# Patient Record
Sex: Female | Born: 1960
Health system: Southern US, Community
[De-identification: ages and names within clinical notes are randomized; demographics above are authoritative.]

## PROBLEM LIST (undated history)

## (undated) DIAGNOSIS — R609 Edema, unspecified: Secondary | ICD-10-CM

## (undated) DIAGNOSIS — E669 Obesity, unspecified: Secondary | ICD-10-CM

## (undated) DIAGNOSIS — I10 Essential (primary) hypertension: Secondary | ICD-10-CM

## (undated) HISTORY — DX: Edema, unspecified: R60.9

## (undated) HISTORY — PX: TUBAL LIGATION: SHX77

## (undated) HISTORY — PX: CHOLECYSTECTOMY: SHX55

## (undated) HISTORY — DX: Obesity, unspecified: E66.9

---

## 2013-10-01 ENCOUNTER — Encounter (HOSPITAL_BASED_OUTPATIENT_CLINIC_OR_DEPARTMENT_OTHER): Payer: Self-pay | Admitting: Emergency Medicine

## 2013-10-01 ENCOUNTER — Emergency Department (HOSPITAL_BASED_OUTPATIENT_CLINIC_OR_DEPARTMENT_OTHER)
Admission: EM | Admit: 2013-10-01 | Discharge: 2013-10-01 | Disposition: A | Discharge status: Home / Self Care | Payer: BC Managed Care – PPO | Attending: Emergency Medicine | Admitting: Emergency Medicine

## 2013-10-01 DIAGNOSIS — Z0189 Encounter for other specified special examinations: Secondary | ICD-10-CM | POA: Diagnosis not present

## 2013-10-01 DIAGNOSIS — J029 Acute pharyngitis, unspecified: Secondary | ICD-10-CM | POA: Diagnosis present

## 2013-10-01 LAB — RAPID STREP SCREEN (MED CTR MEBANE ONLY): Streptococcus, Group A Screen (Direct): NEGATIVE

## 2013-10-01 NOTE — ED Provider Notes (Signed)
CSN: [id number]     Arrival date & time 10/01/13  1536 History   First MD Initiated Contact with Patient 10/01/13 1603     Chief Complaint  Patient presents with  . Sore Throat     (Consider location/radiation/quality/duration/timing/severity/associated sxs/prior Treatment) HPI Comments: Pt states that they had random strep test at work and hers came back positive. She is not having any symptoms  Patient is a 53 y.o. female presenting with pharyngitis. The history is provided by the patient. No language interpreter was used.  Sore Throat This is a new problem. The current episode started today. The problem occurs constantly. The problem has been unchanged. Pertinent negatives include no fever, rash or sore throat. Nothing aggravates the symptoms. She has tried nothing for the symptoms.    History reviewed. No pertinent past medical history. Past Surgical History  Procedure Laterality Date  . Cholecystectomy     No family history on file. History  Substance Use Topics  . Smoking status: Never Smoker   . Smokeless tobacco: Not on file  . Alcohol Use: No   OB History   Grav Para Term Preterm Abortions TAB SAB Ect Mult Living                 Review of Systems  Constitutional: Negative for fever.  HENT: Negative for sore throat.   Respiratory: Negative.   Cardiovascular: Negative.   Skin: Negative for rash.      Allergies  Review of patient's allergies indicates no known allergies.  Home Medications   Prior to Admission medications   Not on File   BP 177/107  Pulse 68  Temp(Src) 98.7 F (37.1 C) (Oral)  Resp 18  Ht  (1.575 m)  Wt 200 lb (90.719 kg)  BMI 36.57 kg/m2  SpO2 99% Physical Exam  Nursing note and vitals reviewed. Constitutional: She is oriented to person, place, and time. She appears well-developed and well-nourished.  HENT:  Right Ear: External ear normal.  Left Ear: External ear normal.  Mouth/Throat: Oropharynx is clear and moist.   Neck: Neck supple.  Cardiovascular: Normal rate and regular rhythm.   Pulmonary/Chest: Effort normal and breath sounds normal.  Musculoskeletal: Normal range of motion.  Neurological: She is alert and oriented to person, place, and time.    ED Course  Procedures (including critical care time) Labs Review Labs Reviewed  RAPID STREP SCREEN  CULTURE, GROUP A STREP    Imaging Review No results found.   EKG Interpretation None      MDM   Final diagnoses:  Laboratory test    Negative strep and pt not symptomatic don't think pt needs to be treated at this time    Teressa Lower, NP 10/01/13 1640

## 2013-10-01 NOTE — ED Notes (Signed)
She had a random strep test at work yesterday that was positive. Denies sore throat.

## 2013-10-01 NOTE — ED Notes (Signed)
Note given for pt to return to work- pt verbalizes understanding to f/u with PCP this week for blood pressure check

## 2013-10-01 NOTE — ED Provider Notes (Signed)
Medical screening examination/treatment/procedure(s) were performed by non-physician practitioner and as supervising physician I was immediately available for consultation/collaboration.   EKG Interpretation None       Ethelda Chick, MD 10/01/13 [phone]

## 2013-10-01 NOTE — Discharge Instructions (Signed)
Preventive Care for Adults A healthy lifestyle and preventive care can promote health and wellness. Preventive health guidelines for women include the following key practices.  A routine yearly physical is a good way to check with your health care provider about your health and preventive screening. It is a chance to share any concerns and updates on your health and to receive a thorough exam.  Visit your dentist for a routine exam and preventive care every 6 months. Brush your teeth twice a day and floss once a day. Good oral hygiene prevents tooth decay and gum disease.  The frequency of eye exams is based on your age, health, family medical history, use of contact lenses, and other factors. Follow your health care provider's recommendations for frequency of eye exams.  Eat a healthy diet. Foods like vegetables, fruits, whole grains, low-fat dairy products, and lean protein foods contain the nutrients you need without too many calories. Decrease your intake of foods high in solid fats, added sugars, and salt. Eat the right amount of calories for you.Get information about a proper diet from your health care provider, if necessary.  Regular physical exercise is one of the most important things you can do for your health. Most adults should get at least 150 minutes of moderate-intensity exercise (any activity that increases your heart rate and causes you to sweat) each week. In addition, most adults need muscle-strengthening exercises on 2 or more days a week.  Maintain a healthy weight. The body mass index (BMI) is a screening tool to identify possible weight problems. It provides an estimate of body fat based on height and weight. Your health care provider can find your BMI and can help you achieve or maintain a healthy weight.For adults 20 years and older:  A BMI below 18.5 is considered underweight.  A BMI of 18.5 to 24.9 is normal.  A BMI of 25 to 29.9 is considered overweight.  A BMI of  30 and above is considered obese.  Maintain normal blood lipids and cholesterol levels by exercising and minimizing your intake of saturated fat. Eat a balanced diet with plenty of fruit and vegetables. Blood tests for lipids and cholesterol should begin at age 20 and be repeated every 5 years. If your lipid or cholesterol levels are high, you are over 50, or you are at high risk for heart disease, you may need your cholesterol levels checked more frequently.Ongoing high lipid and cholesterol levels should be treated with medicines if diet and exercise are not working.  If you smoke, find out from your health care provider how to quit. If you do not use tobacco, do not start.  Lung cancer screening is recommended for adults aged 55-80 years who are at high risk for developing lung cancer because of a history of smoking. A yearly low-dose CT scan of the lungs is recommended for people who have at least a 30-pack-year history of smoking and are a current smoker or have quit within the past 15 years. A pack year of smoking is smoking an average of 1 pack of cigarettes a day for 1 year (for example: 1 pack a day for 30 years or 2 packs a day for 15 years). Yearly screening should continue until the smoker has stopped smoking for at least 15 years. Yearly screening should be stopped for people who develop a health problem that would prevent them from having lung cancer treatment.  If you are pregnant, do not drink alcohol. If you are breastfeeding,   be very cautious about drinking alcohol. If you are not pregnant and choose to drink alcohol, do not have more than 1 drink per day. One drink is considered to be 12 ounces (355 mL) of beer, 5 ounces (148 mL) of wine, or 1.5 ounces (44 mL) of liquor.  Avoid use of street drugs. Do not share needles with anyone. Ask for help if you need support or instructions about stopping the use of drugs.  High blood pressure causes heart disease and increases the risk of  stroke. Your blood pressure should be checked at least every 1 to 2 years. Ongoing high blood pressure should be treated with medicines if weight loss and exercise do not work.  If you are 55-79 years old, ask your health care provider if you should take aspirin to prevent strokes.  Diabetes screening involves taking a blood sample to check your fasting blood sugar level. This should be done once every 3 years, after age 45, if you are within normal weight and without risk factors for diabetes. Testing should be considered at a younger age or be carried out more frequently if you are overweight and have at least 1 risk factor for diabetes.  Breast cancer screening is essential preventive care for women. You should practice "breast self-awareness." This means understanding the normal appearance and feel of your breasts and may include breast self-examination. Any changes detected, no matter how small, should be reported to a health care provider. Women in their 20s and 30s should have a clinical breast exam (CBE) by a health care provider as part of a regular health exam every 1 to 3 years. After age 40, women should have a CBE every year. Starting at age 40, women should consider having a mammogram (breast X-ray test) every year. Women who have a family history of breast cancer should talk to their health care provider about genetic screening. Women at a high risk of breast cancer should talk to their health care providers about having an MRI and a mammogram every year.  Breast cancer gene (BRCA)-related cancer risk assessment is recommended for women who have family members with BRCA-related cancers. BRCA-related cancers include breast, ovarian, tubal, and peritoneal cancers. Having family members with these cancers may be associated with an increased risk for harmful changes (mutations) in the breast cancer genes BRCA1 and BRCA2. Results of the assessment will determine the need for genetic counseling and  BRCA1 and BRCA2 testing.  Routine pelvic exams to screen for cancer are no longer recommended for nonpregnant women who are considered low risk for cancer of the pelvic organs (ovaries, uterus, and vagina) and who do not have symptoms. Ask your health care provider if a screening pelvic exam is right for you.  If you have had past treatment for cervical cancer or a condition that could lead to cancer, you need Pap tests and screening for cancer for at least 20 years after your treatment. If Pap tests have been discontinued, your risk factors (such as having a new sexual partner) need to be reassessed to determine if screening should be resumed. Some women have medical problems that increase the chance of getting cervical cancer. In these cases, your health care provider may recommend more frequent screening and Pap tests.  The HPV test is an additional test that may be used for cervical cancer screening. The HPV test looks for the virus that can cause the cell changes on the cervix. The cells collected during the Pap test can be   tested for HPV. The HPV test could be used to screen women aged 30 years and older, and should be used in women of any age who have unclear Pap test results. After the age of 30, women should have HPV testing at the same frequency as a Pap test.  Colorectal cancer can be detected and often prevented. Most routine colorectal cancer screening begins at the age of 50 years and continues through age 75 years. However, your health care provider may recommend screening at an earlier age if you have risk factors for colon cancer. On a yearly basis, your health care provider may provide home test kits to check for hidden blood in the stool. Use of a small camera at the end of a tube, to directly examine the colon (sigmoidoscopy or colonoscopy), can detect the earliest forms of colorectal cancer. Talk to your health care provider about this at age 50, when routine screening begins. Direct  exam of the colon should be repeated every 5-10 years through age 75 years, unless early forms of pre-cancerous polyps or small growths are found.  People who are at an increased risk for hepatitis B should be screened for this virus. You are considered at high risk for hepatitis B if:  You were born in a country where hepatitis B occurs often. Talk with your health care provider about which countries are considered high risk.  Your parents were born in a high-risk country and you have not received a shot to protect against hepatitis B (hepatitis B vaccine).  You have HIV or AIDS.  You use needles to inject street drugs.  You live with, or have sex with, someone who has hepatitis B.  You get hemodialysis treatment.  You take certain medicines for conditions like cancer, organ transplantation, and autoimmune conditions.  Hepatitis C blood testing is recommended for all people born from 1945 through 1965 and any individual with known risks for hepatitis C.  Practice safe sex. Use condoms and avoid high-risk sexual practices to reduce the spread of sexually transmitted infections (STIs). STIs include gonorrhea, chlamydia, syphilis, trichomonas, herpes, HPV, and human immunodeficiency virus (HIV). Herpes, HIV, and HPV are viral illnesses that have no cure. They can result in disability, cancer, and death.  You should be screened for sexually transmitted illnesses (STIs) including gonorrhea and chlamydia if:  You are sexually active and are younger than 24 years.  You are older than 24 years and your health care provider tells you that you are at risk for this type of infection.  Your sexual activity has changed since you were last screened and you are at an increased risk for chlamydia or gonorrhea. Ask your health care provider if you are at risk.  If you are at risk of being infected with HIV, it is recommended that you take a prescription medicine daily to prevent HIV infection. This is  called preexposure prophylaxis (PrEP). You are considered at risk if:  You are a heterosexual woman, are sexually active, and are at increased risk for HIV infection.  You take drugs by injection.  You are sexually active with a partner who has HIV.  Talk with your health care provider about whether you are at high risk of being infected with HIV. If you choose to begin PrEP, you should first be tested for HIV. You should then be tested every 3 months for as long as you are taking PrEP.  Osteoporosis is a disease in which the bones lose minerals and strength   with aging. This can result in serious bone fractures or breaks. The risk of osteoporosis can be identified using a bone density scan. Women ages 65 years and over and women at risk for fractures or osteoporosis should discuss screening with their health care providers. Ask your health care provider whether you should take a calcium supplement or vitamin D to reduce the rate of osteoporosis.  Menopause can be associated with physical symptoms and risks. Hormone replacement therapy is available to decrease symptoms and risks. You should talk to your health care provider about whether hormone replacement therapy is right for you.  Use sunscreen. Apply sunscreen liberally and repeatedly throughout the day. You should seek shade when your shadow is shorter than you. Protect yourself by wearing long sleeves, pants, a wide-brimmed hat, and sunglasses year round, whenever you are outdoors.  Once a month, do a whole body skin exam, using a mirror to look at the skin on your back. Tell your health care provider of new moles, moles that have irregular borders, moles that are larger than a pencil eraser, or moles that have changed in shape or color.  Stay current with required vaccines (immunizations).  Influenza vaccine. All adults should be immunized every year.  Tetanus, diphtheria, and acellular pertussis (Td, Tdap) vaccine. Pregnant women should  receive 1 dose of Tdap vaccine during each pregnancy. The dose should be obtained regardless of the length of time since the last dose. Immunization is preferred during the 27th-36th week of gestation. An adult who has not previously received Tdap or who does not know her vaccine status should receive 1 dose of Tdap. This initial dose should be followed by tetanus and diphtheria toxoids (Td) booster doses every 10 years. Adults with an unknown or incomplete history of completing a 3-dose immunization series with Td-containing vaccines should begin or complete a primary immunization series including a Tdap dose. Adults should receive a Td booster every 10 years.  Varicella vaccine. An adult without evidence of immunity to varicella should receive 2 doses or a second dose if she has previously received 1 dose. Pregnant females who do not have evidence of immunity should receive the first dose after pregnancy. This first dose should be obtained before leaving the health care facility. The second dose should be obtained 4-8 weeks after the first dose.  Human papillomavirus (HPV) vaccine. Females aged 13-26 years who have not received the vaccine previously should obtain the 3-dose series. The vaccine is not recommended for use in pregnant females. However, pregnancy testing is not needed before receiving a dose. If a female is found to be pregnant after receiving a dose, no treatment is needed. In that case, the remaining doses should be delayed until after the pregnancy. Immunization is recommended for any person with an immunocompromised condition through the age of 26 years if she did not get any or all doses earlier. During the 3-dose series, the second dose should be obtained 4-8 weeks after the first dose. The third dose should be obtained 24 weeks after the first dose and 16 weeks after the second dose.  Zoster vaccine. One dose is recommended for adults aged 60 years or older unless certain conditions are  present.  Measles, mumps, and rubella (MMR) vaccine. Adults born before 1957 generally are considered immune to measles and mumps. Adults born in 1957 or later should have 1 or more doses of MMR vaccine unless there is a contraindication to the vaccine or there is laboratory evidence of immunity to   each of the three diseases. A routine second dose of MMR vaccine should be obtained at least 28 days after the first dose for students attending postsecondary schools, health care workers, or international travelers. People who received inactivated measles vaccine or an unknown type of measles vaccine during 1963-1967 should receive 2 doses of MMR vaccine. People who received inactivated mumps vaccine or an unknown type of mumps vaccine before 1979 and are at high risk for mumps infection should consider immunization with 2 doses of MMR vaccine. For females of childbearing age, rubella immunity should be determined. If there is no evidence of immunity, females who are not pregnant should be vaccinated. If there is no evidence of immunity, females who are pregnant should delay immunization until after pregnancy. Unvaccinated health care workers born before 1957 who lack laboratory evidence of measles, mumps, or rubella immunity or laboratory confirmation of disease should consider measles and mumps immunization with 2 doses of MMR vaccine or rubella immunization with 1 dose of MMR vaccine.  Pneumococcal 13-valent conjugate (PCV13) vaccine. When indicated, a person who is uncertain of her immunization history and has no record of immunization should receive the PCV13 vaccine. An adult aged 19 years or older who has certain medical conditions and has not been previously immunized should receive 1 dose of PCV13 vaccine. This PCV13 should be followed with a dose of pneumococcal polysaccharide (PPSV23) vaccine. The PPSV23 vaccine dose should be obtained at least 8 weeks after the dose of PCV13 vaccine. An adult aged 19  years or older who has certain medical conditions and previously received 1 or more doses of PPSV23 vaccine should receive 1 dose of PCV13. The PCV13 vaccine dose should be obtained 1 or more years after the last PPSV23 vaccine dose.  Pneumococcal polysaccharide (PPSV23) vaccine. When PCV13 is also indicated, PCV13 should be obtained first. All adults aged 65 years and older should be immunized. An adult younger than age 65 years who has certain medical conditions should be immunized. Any person who resides in a nursing home or long-term care facility should be immunized. An adult smoker should be immunized. People with an immunocompromised condition and certain other conditions should receive both PCV13 and PPSV23 vaccines. People with human immunodeficiency virus (HIV) infection should be immunized as soon as possible after diagnosis. Immunization during chemotherapy or radiation therapy should be avoided. Routine use of PPSV23 vaccine is not recommended for American Indians, [place] Natives, or people younger than 65 years unless there are medical conditions that require PPSV23 vaccine. When indicated, people who have unknown immunization and have no record of immunization should receive PPSV23 vaccine. One-time revaccination 5 years after the first dose of PPSV23 is recommended for people aged 19-64 years who have chronic kidney failure, nephrotic syndrome, asplenia, or immunocompromised conditions. People who received 1-2 doses of PPSV23 before age 65 years should receive another dose of PPSV23 vaccine at age 65 years or later if at least 5 years have passed since the previous dose. Doses of PPSV23 are not needed for people immunized with PPSV23 at or after age 65 years.  Meningococcal vaccine. Adults with asplenia or persistent complement component deficiencies should receive 2 doses of quadrivalent meningococcal conjugate (MenACWY-D) vaccine. The doses should be obtained at least 2 months apart.  Microbiologists working with certain meningococcal bacteria, military recruits, people at risk during an outbreak, and people who travel to or live in countries with a high rate of meningitis should be immunized. A first-year college student up through age   21 years who is living in a residence hall should receive a dose if she did not receive a dose on or after her 16th birthday. Adults who have certain high-risk conditions should receive one or more doses of vaccine.  Hepatitis A vaccine. Adults who wish to be protected from this disease, have certain high-risk conditions, work with hepatitis A-infected animals, work in hepatitis A research labs, or travel to or work in countries with a high rate of hepatitis A should be immunized. Adults who were previously unvaccinated and who anticipate close contact with an international adoptee during the first 60 days after arrival in the [place] from a country with a high rate of hepatitis A should be immunized.  Hepatitis B vaccine. Adults who wish to be protected from this disease, have certain high-risk conditions, may be exposed to blood or other infectious body fluids, are household contacts or sex partners of hepatitis B positive people, are clients or workers in certain care facilities, or travel to or work in countries with a high rate of hepatitis B should be immunized.  Haemophilus influenzae type b (Hib) vaccine. A previously unvaccinated person with asplenia or sickle cell disease or having a scheduled splenectomy should receive 1 dose of Hib vaccine. Regardless of previous immunization, a recipient of a hematopoietic stem cell transplant should receive a 3-dose series 6-12 months after her successful transplant. Hib vaccine is not recommended for adults with HIV infection. Preventive Services / Frequency Ages 19 to 39 years  Blood pressure check.** / Every 1 to 2 years.  Lipid and cholesterol check.** / Every 5 years beginning at age  20.  Clinical breast exam.** / Every 3 years for women in their 20s and 30s.  BRCA-related cancer risk assessment.** / For women who have family members with a BRCA-related cancer (breast, ovarian, tubal, or peritoneal cancers).  Pap test.** / Every 2 years from ages 21 through 29. Every 3 years starting at age 30 through age 65 or 70 with a history of 3 consecutive normal Pap tests.  HPV screening.** / Every 3 years from ages 30 through ages 65 to 70 with a history of 3 consecutive normal Pap tests.  Hepatitis C blood test.** / For any individual with known risks for hepatitis C.  Skin self-exam. / Monthly.  Influenza vaccine. / Every year.  Tetanus, diphtheria, and acellular pertussis (Tdap, Td) vaccine.** / Consult your health care provider. Pregnant women should receive 1 dose of Tdap vaccine during each pregnancy. 1 dose of Td every 10 years.  Varicella vaccine.** / Consult your health care provider. Pregnant females who do not have evidence of immunity should receive the first dose after pregnancy.  HPV vaccine. / 3 doses over 6 months, if 26 and younger. The vaccine is not recommended for use in pregnant females. However, pregnancy testing is not needed before receiving a dose.  Measles, mumps, rubella (MMR) vaccine.** / You need at least 1 dose of MMR if you were born in 1957 or later. You may also need a 2nd dose. For females of childbearing age, rubella immunity should be determined. If there is no evidence of immunity, females who are not pregnant should be vaccinated. If there is no evidence of immunity, females who are pregnant should delay immunization until after pregnancy.  Pneumococcal 13-valent conjugate (PCV13) vaccine.** / Consult your health care provider.  Pneumococcal polysaccharide (PPSV23) vaccine.** / 1 to 2 doses if you smoke cigarettes or if you have certain conditions.  Meningococcal vaccine.** /   1 dose if you are age 19 to 21 years and a first-year college  student living in a residence hall, or have one of several medical conditions, you need to get vaccinated against meningococcal disease. You may also need additional booster doses.  Hepatitis A vaccine.** / Consult your health care provider.  Hepatitis B vaccine.** / Consult your health care provider.  Haemophilus influenzae type b (Hib) vaccine.** / Consult your health care provider. Ages 40 to 64 years  Blood pressure check.** / Every 1 to 2 years.  Lipid and cholesterol check.** / Every 5 years beginning at age 20 years.  Lung cancer screening. / Every year if you are aged 55-80 years and have a 30-pack-year history of smoking and currently smoke or have quit within the past 15 years. Yearly screening is stopped once you have quit smoking for at least 15 years or develop a health problem that would prevent you from having lung cancer treatment.  Clinical breast exam.** / Every year after age 40 years.  BRCA-related cancer risk assessment.** / For women who have family members with a BRCA-related cancer (breast, ovarian, tubal, or peritoneal cancers).  Mammogram.** / Every year beginning at age 40 years and continuing for as long as you are in good health. Consult with your health care provider.  Pap test.** / Every 3 years starting at age 30 years through age 65 or 70 years with a history of 3 consecutive normal Pap tests.  HPV screening.** / Every 3 years from ages 30 years through ages 65 to 70 years with a history of 3 consecutive normal Pap tests.  Fecal occult blood test (FOBT) of stool. / Every year beginning at age 50 years and continuing until age 75 years. You may not need to do this test if you get a colonoscopy every 10 years.  Flexible sigmoidoscopy or colonoscopy.** / Every 5 years for a flexible sigmoidoscopy or every 10 years for a colonoscopy beginning at age 50 years and continuing until age 75 years.  Hepatitis C blood test.** / For all people born from 1945 through  1965 and any individual with known risks for hepatitis C.  Skin self-exam. / Monthly.  Influenza vaccine. / Every year.  Tetanus, diphtheria, and acellular pertussis (Tdap/Td) vaccine.** / Consult your health care provider. Pregnant women should receive 1 dose of Tdap vaccine during each pregnancy. 1 dose of Td every 10 years.  Varicella vaccine.** / Consult your health care provider. Pregnant females who do not have evidence of immunity should receive the first dose after pregnancy.  Zoster vaccine.** / 1 dose for adults aged 60 years or older.  Measles, mumps, rubella (MMR) vaccine.** / You need at least 1 dose of MMR if you were born in 1957 or later. You may also need a 2nd dose. For females of childbearing age, rubella immunity should be determined. If there is no evidence of immunity, females who are not pregnant should be vaccinated. If there is no evidence of immunity, females who are pregnant should delay immunization until after pregnancy.  Pneumococcal 13-valent conjugate (PCV13) vaccine.** / Consult your health care provider.  Pneumococcal polysaccharide (PPSV23) vaccine.** / 1 to 2 doses if you smoke cigarettes or if you have certain conditions.  Meningococcal vaccine.** / Consult your health care provider.  Hepatitis A vaccine.** / Consult your health care provider.  Hepatitis B vaccine.** / Consult your health care provider.  Haemophilus influenzae type b (Hib) vaccine.** / Consult your health care provider. Ages 65   years and over  Blood pressure check.** / Every 1 to 2 years.  Lipid and cholesterol check.** / Every 5 years beginning at age 20 years.  Lung cancer screening. / Every year if you are aged 55-80 years and have a 30-pack-year history of smoking and currently smoke or have quit within the past 15 years. Yearly screening is stopped once you have quit smoking for at least 15 years or develop a health problem that would prevent you from having lung cancer  treatment.  Clinical breast exam.** / Every year after age 40 years.  BRCA-related cancer risk assessment.** / For women who have family members with a BRCA-related cancer (breast, ovarian, tubal, or peritoneal cancers).  Mammogram.** / Every year beginning at age 40 years and continuing for as long as you are in good health. Consult with your health care provider.  Pap test.** / Every 3 years starting at age 30 years through age 65 or 70 years with 3 consecutive normal Pap tests. Testing can be stopped between 65 and 70 years with 3 consecutive normal Pap tests and no abnormal Pap or HPV tests in the past 10 years.  HPV screening.** / Every 3 years from ages 30 years through ages 65 or 70 years with a history of 3 consecutive normal Pap tests. Testing can be stopped between 65 and 70 years with 3 consecutive normal Pap tests and no abnormal Pap or HPV tests in the past 10 years.  Fecal occult blood test (FOBT) of stool. / Every year beginning at age 50 years and continuing until age 75 years. You may not need to do this test if you get a colonoscopy every 10 years.  Flexible sigmoidoscopy or colonoscopy.** / Every 5 years for a flexible sigmoidoscopy or every 10 years for a colonoscopy beginning at age 50 years and continuing until age 75 years.  Hepatitis C blood test.** / For all people born from 1945 through 1965 and any individual with known risks for hepatitis C.  Osteoporosis screening.** / A one-time screening for women ages 65 years and over and women at risk for fractures or osteoporosis.  Skin self-exam. / Monthly.  Influenza vaccine. / Every year.  Tetanus, diphtheria, and acellular pertussis (Tdap/Td) vaccine.** / 1 dose of Td every 10 years.  Varicella vaccine.** / Consult your health care provider.  Zoster vaccine.** / 1 dose for adults aged 60 years or older.  Pneumococcal 13-valent conjugate (PCV13) vaccine.** / Consult your health care provider.  Pneumococcal  polysaccharide (PPSV23) vaccine.** / 1 dose for all adults aged 65 years and older.  Meningococcal vaccine.** / Consult your health care provider.  Hepatitis A vaccine.** / Consult your health care provider.  Hepatitis B vaccine.** / Consult your health care provider.  Haemophilus influenzae type b (Hib) vaccine.** / Consult your health care provider. ** Family history and personal history of risk and conditions may change your health care provider's recommendations. Document Released: 03/01/2001 Document Revised: 05/20/2013 Document Reviewed: 05/31/2010 [employer] Patient Information 2015 [employer]. This information is not intended to replace advice given to you by your health care provider. Make sure you discuss any questions you have with your health care provider.  

## 2013-10-03 LAB — CULTURE, GROUP A STREP

## 2013-12-11 ENCOUNTER — Other Ambulatory Visit: Payer: Self-pay | Admitting: Ophthalmology

## 2013-12-11 DIAGNOSIS — H4710 Unspecified papilledema: Secondary | ICD-10-CM

## 2013-12-11 DIAGNOSIS — G060 Intracranial abscess and granuloma: Secondary | ICD-10-CM

## 2013-12-22 ENCOUNTER — Ambulatory Visit
Admission: RE | Admit: 2013-12-22 | Discharge: 2013-12-22 | Disposition: A | Discharge status: Home / Self Care | Payer: BC Managed Care – PPO | Source: Ambulatory Visit | Attending: Ophthalmology | Admitting: Ophthalmology

## 2013-12-22 DIAGNOSIS — H4710 Unspecified papilledema: Secondary | ICD-10-CM

## 2013-12-22 DIAGNOSIS — G060 Intracranial abscess and granuloma: Secondary | ICD-10-CM

## 2013-12-22 MED ORDER — GADOBENATE DIMEGLUMINE 529 MG/ML IV SOLN
20.0000 mL | Freq: Once | INTRAVENOUS | Status: AC | PRN
  Administered 2013-12-22: 20 mL via INTRAVENOUS

## 2014-02-13 ENCOUNTER — Encounter: Payer: Self-pay | Admitting: Neurology

## 2014-02-13 ENCOUNTER — Ambulatory Visit (INDEPENDENT_AMBULATORY_CARE_PROVIDER_SITE_OTHER): Payer: BLUE CROSS/BLUE SHIELD | Admitting: Neurology

## 2014-02-13 VITALS — BP 152/94 | HR 86 | Resp 16 | Ht 62.75 in | Wt 216.0 lb

## 2014-02-13 DIAGNOSIS — G629 Polyneuropathy, unspecified: Secondary | ICD-10-CM

## 2014-02-13 DIAGNOSIS — H4710 Unspecified papilledema: Secondary | ICD-10-CM

## 2014-02-13 DIAGNOSIS — G5603 Carpal tunnel syndrome, bilateral upper limbs: Secondary | ICD-10-CM

## 2014-02-13 DIAGNOSIS — G5602 Carpal tunnel syndrome, left upper limb: Secondary | ICD-10-CM

## 2014-02-13 DIAGNOSIS — G5601 Carpal tunnel syndrome, right upper limb: Secondary | ICD-10-CM

## 2014-02-13 LAB — VITAMIN B12: VITAMIN B 12: 392 pg/mL (ref 211–911)

## 2014-02-13 LAB — CBC
HEMATOCRIT: 31.9 % — AB (ref 36.0–46.0)
Hemoglobin: 9.6 g/dL — ABNORMAL LOW (ref 12.0–15.0)
MCH: 19.5 pg — ABNORMAL LOW (ref 26.0–34.0)
MCHC: 30.1 g/dL (ref 30.0–36.0)
MCV: 64.7 fL — ABNORMAL LOW (ref 78.0–100.0)
MPV: 8.6 fL (ref 8.6–12.4)
PLATELETS: 474 10*3/uL — AB (ref 150–400)
RBC: 4.93 MIL/uL (ref 3.87–5.11)
RDW: 20.8 % — AB (ref 11.5–15.5)
WBC: 6.5 10*3/uL (ref 4.0–10.5)

## 2014-02-13 LAB — COMPREHENSIVE METABOLIC PANEL
ALK PHOS: 80 U/L (ref 39–117)
ALT: 15 U/L (ref 0–35)
AST: 15 U/L (ref 0–37)
Albumin: 3.7 g/dL (ref 3.5–5.2)
BUN: 8 mg/dL (ref 6–23)
CALCIUM: 8.9 mg/dL (ref 8.4–10.5)
CHLORIDE: 105 meq/L (ref 96–112)
CO2: 25 mEq/L (ref 19–32)
Creat: 0.62 mg/dL (ref 0.50–1.10)
Glucose, Bld: 80 mg/dL (ref 70–99)
Potassium: 3.9 mEq/L (ref 3.5–5.3)
Sodium: 139 mEq/L (ref 135–145)
TOTAL PROTEIN: 6.9 g/dL (ref 6.0–8.3)
Total Bilirubin: 0.3 mg/dL (ref 0.2–1.2)

## 2014-02-13 LAB — PROTIME-INR
INR: 1 (ref <1.50)
Prothrombin Time: 13.2 seconds (ref 11.6–15.2)

## 2014-02-13 LAB — TSH: TSH: 1.239 u[IU]/mL (ref 0.350–4.500)

## 2014-02-13 NOTE — Patient Instructions (Addendum)
1. Schedule lumbar puncture to measure opening pressure 2. Bloodwork for CBC, CMP, PT/INR, TSH, B12 3. Minimize Tylenol intake to 2-3 a week to avoid rebound headaches 4. Start wearing a wrist splint on both wrists to prevent worsening of carpal tunnel symptoms 5. Follow-up after lumbar puncture 6. Refer to establish care with PCP

## 2014-02-13 NOTE — Progress Notes (Signed)
NEUROLOGY CONSULTATION NOTE  Judith Barr MRN: [id number] DOB: 1960-01-18  Referring provider: Dr. Merrily Pewony Choi Primary care provider: none listed  Reason for consult:  Optic disc papilledema, blurred vision, headache  Dear Dr Alvino Chapelhoi:  Thank you for your kind referral of Judith Barr for consultation of the above symptoms. Although her history is well known to you, please allow me to reiterate it for the purpose of our medical record. Records and images were personally reviewed where available.  HISTORY OF PRESENT ILLNESS: This is a pleasant 54 year old right-handed woman with no significant past medical history except for headaches since her late 5130s. She reports that over the past couple of months, her headaches have changes, now constant, waxing and waning from a 7/10 to 10/10 intensity. When more intense, she would feel off balance and catch herself, she feels like the ground drops under her briefly 3-4 times a week. Headaches start in the left occipital region then radiate diffusely, with throbbing pain that make her ears hurt. She has some pulsatile tinnitus that comes and goes. She has been taking Tylenol BID for the past couple of weeks. Recently, she has been nauseated and irritated by lights and sounds. She feels her vision is blurred and she cannot see much out of the left eye. She has a cataract but tells me that cataract surgery could not be done until papilledema is addressed. Records from her ophthalmologist were reviewed, on initial visit in October 2015, she had reported decreased left visual acutiy for 2 months. She was noted to have subtle blurred margins OU, hard to see OS optic nerve. MRI brain and orbits with and without contrast done 12/2013 was reported as normal. MRV was negative for venous thrombosis. Repeat vision test in December 2015 showed bilateral mild disc edema.   She denies any dizziness, diplopia, dysarthria, dysphagia, back pain, bowel/bladder dysfunction. 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 She denies any family history of headaches. She has gained a lot of weight since turning 51.   PAST MEDICAL HISTORY: No past medical history on file.  PAST SURGICAL HISTORY: Past Surgical History  Procedure Laterality Date  . Cholecystectomy    . Tubal ligation      MEDICATIONS: No current outpatient prescriptions on file prior to visit.   No current facility-administered medications on file prior to visit.    ALLERGIES: No Known Allergies  FAMILY HISTORY: Family History  Problem Relation Age of Onset  . Diabetes Mother   . Diabetes Sister   . Diabetes Maternal Grandmother     SOCIAL HISTORY: History   Social History  . Marital Status: Married    Spouse Name: N/A    Number of Children: N/A  . Years of Education: N/A   Occupational History  . Not on file.   Social History Main Topics  . Smoking status: Never Smoker   . Smokeless tobacco: Not on file  . Alcohol Use: No  . Drug Use: No  . Sexual Activity: Not on file   Other Topics Concern  . Not on file   Social History Narrative    REVIEW OF SYSTEMS: Constitutional: No fevers, chills, or sweats, no generalized fatigue, change in appetite Eyes: No visual changes, double vision, eye pain Ear, nose and throat: No hearing loss, ear pain, nasal congestion, sore throat Cardiovascular: No chest pain, palpitations Respiratory:  No shortness of breath at rest or with exertion, wheezes GastrointestinaI: No  nausea, vomiting, diarrhea, abdominal pain, fecal incontinence Genitourinary:  No dysuria, urinary retention or frequency Musculoskeletal:  +neck pain, no back pain Integumentary: No rash, pruritus, skin lesions Neurological: as above Psychiatric: No depression, insomnia, anxiety Endocrine: No palpitations, fatigue, diaphoresis, mood swings, change in appetite, change in weight, increased thirst Hematologic/Lymphatic:  No anemia,  purpura, petechiae. Allergic/Immunologic: no itchy/runny eyes, nasal congestion, recent allergic reactions, rashes  PHYSICAL EXAM: Filed Vitals:   02/13/14 0843  BP: 152/94  Pulse: 86  Resp: 16   General: No acute distress Head:  Normocephalic/atraumatic Eyes: Fundoscopic exam shows blurred disc bilaterally, venous pulsations noted.  Neck: supple, no paraspinal tenderness, full range of motion Back: No paraspinal tenderness Heart: regular rate and rhythm Lungs: Clear to auscultation bilaterally. Vascular: No carotid bruits. Skin/Extremities: No rash, no edema Neurological Exam: Mental status: alert and oriented to person, place, and time, no dysarthria or aphasia, Fund of knowledge is appropriate.  Recent and remote memory are intact.  Attention and concentration are normal.    Able to name objects and repeat phrases. Cranial nerves: CN I: not tested CN II: pupils equal, round and reactive to light, visual fields intact, fundi as above CN III, IV, VI:  full range of motion, no nystagmus, no ptosis CN V: facial sensation intact CN VII: upper and lower face symmetric CN VIII: hearing intact to finger rub CN IX, X: gag intact, uvula midline CN XI: sternocleidomastoid and trapezius muscles intact CN XII: tongue midline Bulk & Tone: normal, no fasciculations. Motor: 5/5 throughout with no pronator drift. Sensation: decreased cold and pin on the left LE, decreased pin on right hand, decreased vibration to both ankles. Romberg test positive sway Deep Tendon Reflexes: +2 throughout except for absent ankle jerks, no ankle clonus Plantar responses: downgoing bilaterally Cerebellar: no incoordination on finger to nose, heel to shin. No dysdiadochokinesia Gait: narrow-based and steady, able to tandem walk adequately. Tremor: none +Phalen's sign with tingling in both hands; negative Tinel sign at wrist and elbow  IMPRESSION: This is a pleasant 54 year old right-handed woman with a  history of headaches that have increased in frequency over the past few months, pulsatile tinnitus, blurred vision, with bilateral optic disc edema concerning for idiopathic intracranial hypertension. MRI/MRV brain unremarkable. She will be scheduled for a lumbar puncture to measure opening pressure. We will plan to start either Diamox or Topamax after the LP. Check safety labs CBC, CMP, PT/INR. 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 She will start wearing wrist splints. We discussed rebound headaches and minimizing Tylenol intake to 2-3 a week. We also discussed weight loss and the benefits of this in patients with IIH. She will follow-up after the LP.  Thank you for allowing me to participate in the care of this patient. Please do not hesitate to call for any questions or concerns.   Patrcia Dolly, M.D.  CC: Dr. Alvino Chapel

## 2014-02-17 ENCOUNTER — Telehealth: Payer: Self-pay | Admitting: Neurology

## 2014-02-17 NOTE — Telephone Encounter (Signed)
Pt states that someone from our office called her and she was unsure who called but thought it was a Engineer, civil (consulting)nurse. Please call [phone] or [phone]

## 2014-02-18 ENCOUNTER — Encounter: Payer: Self-pay | Admitting: Neurology

## 2014-02-20 ENCOUNTER — Other Ambulatory Visit: Payer: Self-pay | Admitting: Neurology

## 2014-02-20 ENCOUNTER — Ambulatory Visit
Admission: RE | Admit: 2014-02-20 | Discharge: 2014-02-20 | Disposition: A | Discharge status: Home / Self Care | Payer: BLUE CROSS/BLUE SHIELD | Source: Ambulatory Visit | Attending: Neurology | Admitting: Neurology

## 2014-02-20 VITALS — BP 138/85 | HR 52

## 2014-02-20 DIAGNOSIS — H4710 Unspecified papilledema: Secondary | ICD-10-CM

## 2014-02-20 DIAGNOSIS — R836 Abnormal cytological findings in cerebrospinal fluid: Secondary | ICD-10-CM

## 2014-02-20 LAB — CSF CELL COUNT WITH DIFFERENTIAL
RBC COUNT CSF: 0 uL
Tube #: 3
WBC CSF: 0 uL (ref 0–5)

## 2014-02-20 LAB — PROTEIN, CSF: Total Protein, CSF: 19 mg/dL (ref 15–45)

## 2014-02-20 LAB — GLUCOSE, CSF: GLUCOSE CSF: 60 mg/dL (ref 43–76)

## 2014-02-20 NOTE — Discharge Instructions (Signed)

## 2014-02-20 NOTE — Progress Notes (Signed)
Discharge instructions explained to patient.  jkl

## 2014-02-21 ENCOUNTER — Telehealth: Payer: Self-pay | Admitting: Neurology

## 2014-02-21 NOTE — Telephone Encounter (Signed)
Left VM for patient to call back to discuss LP opening pressure 31, plan to start Diamox

## 2014-02-21 NOTE — Telephone Encounter (Signed)
Tried to reach Judith Barr third time today. Tiffany, can you pls f/u with her on Monday. Pls let her know the spinal tap had shown elevated pressure, I would like her to start Diamox 250mg  twice a day to reduce pressure. Pls let her know main side effects of medication in some people are tingling in fingers and toes, increased urination. Do not stop unless very bothersome. Pls send Rx. Thanks

## 2014-02-21 NOTE — Telephone Encounter (Signed)
Tried calling again, left VM for patient to call back

## 2014-02-23 LAB — CSF CULTURE W GRAM STAIN
Gram Stain: NONE SEEN
Organism ID, Bacteria: NO GROWTH

## 2014-02-23 LAB — CSF CULTURE: GRAM STAIN: NONE SEEN

## 2014-02-24 MED ORDER — ACETAZOLAMIDE 250 MG PO TABS: 250.0000 mg | ORAL_TABLET | Freq: Two times a day (BID) | ORAL | Status: DC

## 2014-02-24 NOTE — Addendum Note (Signed)
Addended by: Franciso BendMCNEIL, Josemiguel Gries M on: 02/24/2014 09:51 AM   Modules accepted: Orders

## 2014-02-24 NOTE — Telephone Encounter (Signed)
Patient did return my call. Explained to her the result of LP & the need for the new Rx. Did advise patient about side effects. Rx was sent to patient's pharmacy.

## 2014-02-24 NOTE — Telephone Encounter (Signed)
Left msg at patients home to return my call. 

## 2014-03-14 ENCOUNTER — Encounter: Payer: Self-pay | Admitting: Neurology

## 2014-03-14 ENCOUNTER — Ambulatory Visit (INDEPENDENT_AMBULATORY_CARE_PROVIDER_SITE_OTHER): Payer: BLUE CROSS/BLUE SHIELD | Admitting: Neurology

## 2014-03-14 VITALS — BP 110/82 | HR 68 | Ht 62.0 in | Wt 221.0 lb

## 2014-03-14 DIAGNOSIS — G932 Benign intracranial hypertension: Secondary | ICD-10-CM

## 2014-03-14 MED ORDER — ACETAZOLAMIDE 250 MG PO TABS: ORAL_TABLET | ORAL | Status: DC

## 2014-03-14 NOTE — Progress Notes (Deleted)
NEUROLOGY FOLLOW UP OFFICE NOTE  Judith Barr [id number]  HISTORY OF PRESENT ILLNESS: I had the pleasure of seeing *** in follow-up in the neurology clinic on ***.  The patient was last seen on *** and is accompanied by *** today.  Records and images were personally reviewed where available.  ***. Vision worsening, more blurry; left eye No vision loss, starting to itch and burn Legs feel sore; occl tingling;still feeling the drop but less HAs 5/10; feels ear infxn on right hurts Stopped tylenol  This is a pleasant 54 year old right-handed woman with no significant past medical history except for headaches since her late 27s. She reports that over the past couple of months, her headaches have changes, now constant, waxing and waning from a 7/10 to 10/10 intensity. When more intense, she would feel off balance and catch herself, she feels like the ground drops under her briefly 3-4 times a week. Headaches start in the left occipital region then radiate diffusely, with throbbing pain that make her ears hurt. She has some pulsatile tinnitus that comes and goes. She has been taking Tylenol BID for the past couple of weeks. Recently, she has been nauseated and irritated by lights and sounds. She feels her vision is blurred and she cannot see much out of the left eye. She has a cataract but tells me that cataract surgery could not be done until papilledema is addressed. Records from her ophthalmologist were reviewed, on initial visit in October 2015, she had reported decreased left visual acutiy for 2 months. She was noted to have subtle blurred margins OU, hard to see OS optic nerve. MRI brain and orbits with and without contrast done 12/2013 was reported as normal. MRV was negative for venous thrombosis. Repeat vision test in December 2015 showed bilateral mild disc edema.   She denies any dizziness, diplopia, dysarthria, dysphagia, back pain, bowel/bladder dysfunction. 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 She denies any family history of headaches. She has gained a lot of weight since turning 51.  PAST MEDICAL HISTORY: No past medical history on file.  MEDICATIONS: Current Outpatient Prescriptions on File Prior to Visit  Medication Sig Dispense Refill  . acetaZOLAMIDE (DIAMOX) 250 MG tablet Take 1 tablet (250 mg total) by mouth 2 (two) times daily. 60 tablet 3   No current facility-administered medications on file prior to visit.    ALLERGIES: No Known Allergies  FAMILY HISTORY: Family History  Problem Relation Age of Onset  . Diabetes Mother   . Diabetes Sister   . Diabetes Maternal Grandmother     SOCIAL HISTORY: History   Social History  . Marital Status: Married    Spouse Name: N/A  . Number of Children: N/A  . Years of Education: N/A   Occupational History  . Not on file.   Social History Main Topics  . Smoking status: Never Smoker   . Smokeless tobacco: Not on file  . Alcohol Use: No  . Drug Use: No  . Sexual Activity: Not on file   Other Topics Concern  . Not on file   Social History Narrative    REVIEW OF SYSTEMS: Constitutional: No fevers, chills, or sweats, no generalized fatigue, change in appetite Eyes: No visual changes, double vision, eye pain Ear, nose and throat: No hearing loss, ear pain, nasal congestion, sore throat Cardiovascular: No chest pain, palpitations Respiratory:  No shortness of breath at rest or with exertion,  wheezes GastrointestinaI: No nausea, vomiting, diarrhea, abdominal pain, fecal incontinence Genitourinary:  No dysuria, urinary retention or frequency Musculoskeletal:  No neck pain, back pain Integumentary: No rash, pruritus, skin lesions Neurological: as above Psychiatric: No depression, insomnia, anxiety Endocrine: No palpitations, fatigue, diaphoresis, mood swings, change in appetite, change in weight, increased thirst Hematologic/Lymphatic:  No anemia, purpura,  petechiae. Allergic/Immunologic: no itchy/runny eyes, nasal congestion, recent allergic reactions, rashes  PHYSICAL EXAM: Filed Vitals:   03/14/14 1240  BP: 110/82  Pulse: 68   General: No acute distress Head:  Normocephalic/atraumatic Neck: supple, no paraspinal tenderness, full range of motion Heart:  Regular rate and rhythm Lungs:  Clear to auscultation bilaterally Back: No paraspinal tenderness Skin/Extremities: No rash, no edema Neurological Exam: alert and oriented to person, place, and time. No aphasia or dysarthria. Fund of knowledge is appropriate.  Recent and remote memory are intact.  Attention and concentration are normal.    Able to name objects and repeat phrases. Cranial nerves: Pupils equal, round, reactive to light.  Fundoscopic exam unremarkable, no papilledema. Extraocular movements intact with no nystagmus. Visual fields full. Facial sensation intact. No facial asymmetry. Tongue, uvula, palate midline.  Motor: Bulk and tone normal, muscle strength 5/5 throughout with no pronator drift.  Sensation to light touch, temperature and vibration intact.  No extinction to double simultaneous stimulation.  Deep tendon reflexes 2+ throughout, toes downgoing.  Finger to nose testing intact.  Gait narrow-based and steady, able to tandem walk adequately.  Romberg negative.  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 MRI/MRV brain unremarkable. She will be scheduled for a lumbar puncture to measure opening pressure. We will plan to start either Diamox or Topamax after the LP. Check safety labs CBC, CMP, PT/INR. 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 She will start wearing wrist splints. We discussed  rebound headaches and minimizing Tylenol intake to 2-3 a week. We also discussed weight loss and the benefits of this in patients with IIH. She will follow-up after the LP.   Medications: Diagnostic testing: Labs: Imaging: Consults: Return to clinic in *** months.  Thank you for allowing me to participate in *** care.  Please do not hesitate to call for any questions or concerns.  The duration of this appointment visit was *** minutes of face-to-face time with the patient.  Greater than 50% of this time was spent in counseling, explanation of diagnosis, planning of further management, and coordination of care.   Patrcia DollyKaren Aquino, M.D.   CC: ***

## 2014-03-14 NOTE — Patient Instructions (Addendum)
1. Increase Diamox 250mg : Take 1 tab in AM, 2 tabs in PM for 1 week, then increase to 2 tabs twice a day 2. Call your eye doctor for earlier appointment 3. Follow-up in 3 months, call our office for any problems

## 2014-03-20 LAB — FUNGUS CULTURE W SMEAR: Smear Result: NONE SEEN

## 2014-03-22 NOTE — Progress Notes (Signed)
NEUROLOGY FOLLOW UP OFFICE NOTE  Judith Barr [id number]  HISTORY OF PRESENT ILLNESS: I had the pleasure of seeing Judith Barr in follow-up in the neurology clinic on 03/14/2014.  The patient was last seen a month ago for worsening headaches and papilledema. MRI/MRV brain normal. She underwent a lumbar puncture with an elevated opening pressure of 31 cm H2O. Closing pressure was 18. CSF studies normal. She started Diamox, currently on 250mg  BID. She reports headaches are 5/10 but still frequent. She feels her vision is worsening, more blurred on the left eye, no vision loss. Her eyes has started to itch and burn. She has stopped daily Tylenol intake. She has soreness in her legs, occasional tingling. She occasionally feels that the ground is dropping from under her, but states this is less. She feels she may have a right ear infection with right ear pain. No fever or ear drainage.  HPI: This is a pleasant 54 yo RH woman with no significant past medical history except for headaches since her late 54. She presented with worsening headaches, now constant, waxing and waning from a 7/10 to 10/10 intensity. When more intense, she would feel off balance and catch herself, she feels like the ground drops under her briefly 3-4 times a week. Headaches start in the left occipital region then radiate diffusely, with throbbing pain that make her ears hurt. She has some pulsatile tinnitus that comes and goes. She had been taking Tylenol BID. Recently, she has been nauseated and irritated by lights and sounds. She feels her vision is blurred and she cannot see much out of the left eye. She has a cataract but tells me that cataract surgery could not be done until papilledema is addressed. Records from her ophthalmologist were reviewed, on initial visit in October 2015, she had reported decreased left visual acuity for 2 months. She was noted to have subtle blurred margins OU, hard to see OS optic nerve. MRI brain and  orbits with and without contrast done 12/2013 was reported as normal. MRV was negative for venous thrombosis. Repeat vision test in December 2015 showed bilateral mild disc edema.    PAST MEDICAL HISTORY: No past medical history on file.  MEDICATIONS: Current Outpatient Prescriptions on File Prior to Visit  Medication Sig Dispense Refill  . acetaZOLAMIDE (DIAMOX) 250 MG tablet Take 1 tablet (250 mg total) by mouth 2 (two) times daily. 60 tablet 3   No current facility-administered medications on file prior to visit.    ALLERGIES: No Known Allergies  FAMILY HISTORY: Family History  Problem Relation Age of Onset  . Diabetes Mother   . Diabetes Sister   . Diabetes Maternal Grandmother     SOCIAL HISTORY: History   Social History  . Marital Status: Married    Spouse Name: N/A  . Number of Children: N/A  . Years of Education: N/A   Occupational History  . Not on file.   Social History Main Topics  . Smoking status: Never Smoker   . Smokeless tobacco: Not on file  . Alcohol Use: No  . Drug Use: No  . Sexual Activity: Not on file   Other Topics Concern  . Not on file   Social History Narrative    REVIEW OF SYSTEMS: Constitutional: No fevers, chills, or sweats, no generalized fatigue, change in appetite Eyes: No visual changes, double vision, eye pain Ear, nose and throat: No hearing loss, ear pain, nasal congestion, sore throat Cardiovascular: No chest pain, palpitations Respiratory:  No  shortness of breath at rest or with exertion, wheezes GastrointestinaI: No nausea, vomiting, diarrhea, abdominal pain, fecal incontinence Genitourinary:  No dysuria, urinary retention or frequency Musculoskeletal:  No neck pain, back pain Integumentary: No rash, pruritus, skin lesions Neurological: as above Psychiatric: No depression, insomnia, anxiety Endocrine: No palpitations, fatigue, diaphoresis, mood swings, change in appetite, change in weight, increased  thirst Hematologic/Lymphatic:  No anemia, purpura, petechiae. Allergic/Immunologic: no itchy/runny eyes, nasal congestion, recent allergic reactions, rashes  PHYSICAL EXAM: Filed Vitals:   03/14/14 1240  BP: 110/82  Pulse: 68   General: No acute distress Head:  Normocephalic/atraumatic Neck: supple, no paraspinal tenderness, full range of motion Heart:  Regular rate and rhythm Lungs:  Clear to auscultation bilaterally Back: No paraspinal tenderness Skin/Extremities: No rash, no edema Neurological Exam: alert and oriented to person, place, and time. No aphasia or dysarthria. Fund of knowledge is appropriate.  Recent and remote memory are intact.  Attention and concentration are normal.    Able to name objects and repeat phrases. Cranial nerves: Pupils equal, round, reactive to light.  Fundoscopic exam unremarkable, slight blurring of discs, venous pulsations noted. Extraocular movements intact with no nystagmus. Visual fields full on gross confrontation. Facial sensation intact. No facial asymmetry. Tongue, uvula, palate midline.  Motor: Bulk and tone normal, muscle strength 5/5 throughout with no pronator drift.  Sensation to light touch intact.  No extinction to double simultaneous stimulation.  Deep tendon reflexes 2+ throughout, toes downgoing.  Finger to nose testing intact.  Gait narrow-based and steady, able to tandem walk adequately.  Romberg negative.  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 She started Diamox with no change in symptoms, and will continue to increase to  BID. Side effects were discussed. She reports worsening vision on the left eye and will see her eye doctor for repeat exam. She will follow-up in 3 months and knows to call our office for any changes.   Thank you for allowing me to  participate in her care.  Please do not hesitate to call for any questions or concerns.  The duration of this appointment visit was 15 minutes of face-to-face time with the patient.  Greater than 50% of this time was spent in counseling, explanation of diagnosis, planning of further management, and coordination of care.   Patrcia Dolly, M.D.

## 2014-03-31 DIAGNOSIS — G932 Benign intracranial hypertension: Secondary | ICD-10-CM | POA: Insufficient documentation

## 2014-03-31 DIAGNOSIS — IMO0002 Reserved for concepts with insufficient information to code with codable children: Secondary | ICD-10-CM | POA: Insufficient documentation

## 2014-04-15 ENCOUNTER — Telehealth: Payer: Self-pay | Admitting: Neurology

## 2014-04-15 NOTE — Telephone Encounter (Signed)
Records [facility]hthal[facility] reviewed:  Assessment and Plan: 1. Visual field defect 2. IIH 3. Posterior subcapsular cataract, left  This patient has no optic disc edema today. This raises several possibilities: 1. Mostly likely had previous optic disc swelling, adequately treated with Diamox with resolution of optic disc edema 2. Possible has elevated intracranial pressure, but normal disks, which can occur in some individuals due to anatomic variation of optic nerve sheaths 3. May not have IIH, though less likely as optic disc swelling has been observed  Suspicion is that visual field defects are entirely attributed to her impressive posterior subcapsular cataract in the left eye, no contraindication to surgery.

## 2014-06-19 ENCOUNTER — Ambulatory Visit: Payer: BLUE CROSS/BLUE SHIELD | Admitting: Neurology

## 2014-07-24 ENCOUNTER — Ambulatory Visit: Payer: BLUE CROSS/BLUE SHIELD | Admitting: Neurology

## 2015-03-27 ENCOUNTER — Encounter (HOSPITAL_BASED_OUTPATIENT_CLINIC_OR_DEPARTMENT_OTHER): Payer: Self-pay | Admitting: *Deleted

## 2015-03-27 ENCOUNTER — Emergency Department (HOSPITAL_BASED_OUTPATIENT_CLINIC_OR_DEPARTMENT_OTHER)
Admission: EM | Admit: 2015-03-27 | Discharge: 2015-03-27 | Disposition: A | Discharge status: Home / Self Care | Payer: BLUE CROSS/BLUE SHIELD | Attending: Emergency Medicine | Admitting: Emergency Medicine

## 2015-03-27 ENCOUNTER — Emergency Department (HOSPITAL_BASED_OUTPATIENT_CLINIC_OR_DEPARTMENT_OTHER): Payer: BLUE CROSS/BLUE SHIELD

## 2015-03-27 DIAGNOSIS — Y998 Other external cause status: Secondary | ICD-10-CM | POA: Insufficient documentation

## 2015-03-27 DIAGNOSIS — S3992XA Unspecified injury of lower back, initial encounter: Secondary | ICD-10-CM | POA: Diagnosis present

## 2015-03-27 DIAGNOSIS — S4990XA Unspecified injury of shoulder and upper arm, unspecified arm, initial encounter: Secondary | ICD-10-CM | POA: Diagnosis not present

## 2015-03-27 DIAGNOSIS — Z79899 Other long term (current) drug therapy: Secondary | ICD-10-CM | POA: Insufficient documentation

## 2015-03-27 DIAGNOSIS — S39012A Strain of muscle, fascia and tendon of lower back, initial encounter: Secondary | ICD-10-CM | POA: Insufficient documentation

## 2015-03-27 DIAGNOSIS — Y92410 Unspecified street and highway as the place of occurrence of the external cause: Secondary | ICD-10-CM | POA: Diagnosis not present

## 2015-03-27 DIAGNOSIS — I10 Essential (primary) hypertension: Secondary | ICD-10-CM | POA: Insufficient documentation

## 2015-03-27 DIAGNOSIS — Y9389 Activity, other specified: Secondary | ICD-10-CM | POA: Diagnosis not present

## 2015-03-27 MED ORDER — TRAMADOL HCL 50 MG PO TABS: 50.0000 mg | ORAL_TABLET | Freq: Four times a day (QID) | ORAL | Status: DC | PRN

## 2015-03-27 MED ORDER — AMLODIPINE BESYLATE 5 MG PO TABS: 5.0000 mg | ORAL_TABLET | Freq: Every day | ORAL | Status: DC

## 2015-03-27 MED ORDER — ACETAMINOPHEN 325 MG PO TABS
650.0000 mg | ORAL_TABLET | Freq: Once | ORAL | Status: AC
  Administered 2015-03-27: 650 mg via ORAL
  Filled 2015-03-27: qty 2

## 2015-03-27 MED ORDER — CYCLOBENZAPRINE HCL 5 MG PO TABS: 5.0000 mg | ORAL_TABLET | Freq: Three times a day (TID) | ORAL | Status: DC | PRN

## 2015-03-27 NOTE — Discharge Instructions (Signed)
Lumbosacral Strain Lumbosacral strain is a strain of any of the parts that make up your lumbosacral vertebrae. Your lumbosacral vertebrae are the bones that make up the lower third of your backbone. Your lumbosacral vertebrae are held together by muscles and tough, fibrous tissue (ligaments).  CAUSES  A sudden blow to your back can cause lumbosacral strain. Also, anything that causes an excessive stretch of the muscles in the low back can cause this strain. This is typically seen when people exert themselves strenuously, fall, lift heavy objects, bend, or crouch repeatedly. RISK FACTORS  Physically demanding work.  Participation in pushing or pulling sports or sports that require a sudden twist of the back (tennis, golf, baseball).  Weight lifting.  Excessive lower back curvature.  Forward-tilted pelvis.  Weak back or abdominal muscles or both.  Tight hamstrings. SIGNS AND SYMPTOMS  Lumbosacral strain may cause pain in the area of your injury or pain that moves (radiates) down your leg.  DIAGNOSIS Your health care provider can often diagnose lumbosacral strain through a physical exam. In some cases, you may need tests such as X-ray exams.  TREATMENT  Treatment for your lower back injury depends on many factors that your clinician will have to evaluate. However, most treatment will include the use of anti-inflammatory medicines. HOME CARE INSTRUCTIONS   Avoid hard physical activities (tennis, racquetball, waterskiing) if you are not in proper physical condition for it. This may aggravate or create problems.  If you have a back problem, avoid sports requiring sudden body movements. Swimming and walking are generally safer activities.  Maintain good posture.  Maintain a healthy weight.  For acute conditions, you may put ice on the injured area.  Put ice in a plastic bag.  Place a towel between your skin and the bag.  Leave the ice on for 20 minutes, 2-3 times a day.  When  the low back starts healing, stretching and strengthening exercises may be recommended. SEEK MEDICAL CARE IF:  Your back pain is getting worse.  You experience severe back pain not relieved with medicines. SEEK IMMEDIATE MEDICAL CARE IF:   You have numbness, tingling, weakness, or problems with the use of your arms or legs.  There is a change in bowel or bladder control.  You have increasing pain in any area of the body, including your belly (abdomen).  You notice shortness of breath, dizziness, or feel faint.  You feel sick to your stomach (nauseous), are throwing up (vomiting), or become sweaty.  You notice discoloration of your toes or legs, or your feet get very cold. MAKE SURE YOU:   Understand these instructions.  Will watch your condition.  Will get help right away if you are not doing well or get worse.   This information is not intended to replace advice given to you by your health care provider. Make sure you discuss any questions you have with your health care provider.   Document Released: 10/13/2004 Document Revised: 01/24/2014 Document Reviewed: 08/22/2012 Elsevier Interactive Patient Education 2016 [employer].  Hypertension Hypertension, commonly called high blood pressure, is when the force of blood pumping through your arteries is too strong. Your arteries are the blood vessels that carry blood from your heart throughout your body. A blood pressure reading consists of a higher number over a lower number, such as 110/72. The higher number (systolic) is the pressure inside your arteries when your heart pumps. The lower number (diastolic) is the pressure inside your arteries when your heart relaxes. Ideally  you want your blood pressure below 120/80. Hypertension forces your heart to work harder to pump blood. Your arteries may become narrow or stiff. Having untreated or uncontrolled hypertension can cause heart attack, stroke, kidney disease, and other  problems. RISK FACTORS Some risk factors for high blood pressure are controllable. Others are not.  Risk factors you cannot control include:   Race. You may be at higher risk if you are African American.  Age. Risk increases with age.  Gender. Men are at higher risk than women before age 54 years. After age 54, women are at higher risk than men. Risk factors you can control include:  Not getting enough exercise or physical activity.  Being overweight.  Getting too much fat, sugar, calories, or salt in your diet.  Drinking too much alcohol. SIGNS AND SYMPTOMS Hypertension does not usually cause signs or symptoms. Extremely high blood pressure (hypertensive crisis) may cause headache, anxiety, shortness of breath, and nosebleed. DIAGNOSIS To check if you have hypertension, your health care provider will measure your blood pressure while you are seated, with your arm held at the level of your heart. It should be measured at least twice using the same arm. Certain conditions can cause a difference in blood pressure between your right and left arms. A blood pressure reading that is higher than normal on one occasion does not mean that you need treatment. If it is not clear whether you have high blood pressure, you may be asked to return on a different day to have your blood pressure checked again. Or, you may be asked to monitor your blood pressure at home for 1 or more weeks. TREATMENT Treating high blood pressure includes making lifestyle changes and possibly taking medicine. Living a healthy lifestyle can help lower high blood pressure. You may need to change some of your habits. Lifestyle changes may include:  Following the DASH diet. This diet is high in fruits, vegetables, and whole grains. It is low in salt, red meat, and added sugars.  Keep your sodium intake below 2,300 mg per day.  Getting at least 30-45 minutes of aerobic exercise at least 4 times per week.  Losing weight if  necessary.  Not smoking.  Limiting alcoholic beverages.  Learning ways to reduce stress. Your health care provider may prescribe medicine if lifestyle changes are not enough to get your blood pressure under control, and if one of the following is true:  You are 4218-54 years of age and your systolic blood pressure is above 140.  You are 54 years of age or older, and your systolic blood pressure is above 150.  Your diastolic blood pressure is above 90.  You have diabetes, and your systolic blood pressure is over 140 or your diastolic blood pressure is over 90.  You have kidney disease and your blood pressure is above 140/90.  You have heart disease and your blood pressure is above 140/90. Your personal target blood pressure may vary depending on your medical conditions, your age, and other factors. HOME CARE INSTRUCTIONS  Have your blood pressure rechecked as directed by your health care provider.   Take medicines only as directed by your health care provider. Follow the directions carefully. Blood pressure medicines must be taken as prescribed. The medicine does not work as well when you skip doses. Skipping doses also puts you at risk for problems.  Do not smoke.   Monitor your blood pressure at home as directed by your health care provider. SEEK MEDICAL CARE  IF:   You think you are having a reaction to medicines taken.  You have recurrent headaches or feel dizzy.  You have swelling in your ankles.  You have trouble with your vision. SEEK IMMEDIATE MEDICAL CARE IF:  You develop a severe headache or confusion.  You have unusual weakness, numbness, or feel faint.  You have severe chest or abdominal pain.  You vomit repeatedly.  You have trouble breathing. MAKE SURE YOU:   Understand these instructions.  Will watch your condition.  Will get help right away if you are not doing well or get worse.   This information is not intended to replace advice given to you  by your health care provider. Make sure you discuss any questions you have with your health care provider.   Document Released: 01/03/2005 Document Revised: 05/20/2014 Document Reviewed: 10/26/2012 Elsevier Interactive Patient Education [employer]
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
## 2015-04-03 ENCOUNTER — Encounter (HOSPITAL_BASED_OUTPATIENT_CLINIC_OR_DEPARTMENT_OTHER): Payer: Self-pay

## 2015-04-03 ENCOUNTER — Emergency Department (HOSPITAL_BASED_OUTPATIENT_CLINIC_OR_DEPARTMENT_OTHER)
Admission: EM | Admit: 2015-04-03 | Discharge: 2015-04-03 | Disposition: A | Discharge status: Home / Self Care | Payer: BLUE CROSS/BLUE SHIELD | Attending: Emergency Medicine | Admitting: Emergency Medicine

## 2015-04-03 ENCOUNTER — Emergency Department (HOSPITAL_BASED_OUTPATIENT_CLINIC_OR_DEPARTMENT_OTHER): Payer: BLUE CROSS/BLUE SHIELD

## 2015-04-03 DIAGNOSIS — Z79899 Other long term (current) drug therapy: Secondary | ICD-10-CM | POA: Diagnosis not present

## 2015-04-03 DIAGNOSIS — Y998 Other external cause status: Secondary | ICD-10-CM | POA: Diagnosis not present

## 2015-04-03 DIAGNOSIS — S72421A Displaced fracture of lateral condyle of right femur, initial encounter for closed fracture: Secondary | ICD-10-CM | POA: Diagnosis not present

## 2015-04-03 DIAGNOSIS — Y92410 Unspecified street and highway as the place of occurrence of the external cause: Secondary | ICD-10-CM | POA: Diagnosis not present

## 2015-04-03 DIAGNOSIS — S8991XA Unspecified injury of right lower leg, initial encounter: Secondary | ICD-10-CM | POA: Diagnosis present

## 2015-04-03 DIAGNOSIS — Y9389 Activity, other specified: Secondary | ICD-10-CM | POA: Insufficient documentation

## 2015-04-03 DIAGNOSIS — S72499A Other fracture of lower end of unspecified femur, initial encounter for closed fracture: Secondary | ICD-10-CM

## 2015-04-03 DIAGNOSIS — S72413A Displaced unspecified condyle fracture of lower end of unspecified femur, initial encounter for closed fracture: Secondary | ICD-10-CM
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
## 2015-04-03 NOTE — Discharge Instructions (Signed)
How to Use a Knee Brace A knee brace is a device that you wear to support your knee, especially if the knee is healing after an injury or surgery. There are several types of knee braces. Some are designed to prevent an injury (prophylactic brace). These are often worn during sports. Others support an injured knee (functional brace) or keep it still while it heals (rehabilitative brace). People with severe arthritis of the knee may benefit from a brace that takes some pressure off the knee (unloader brace). Most knee braces are made from a combination of cloth and metal or plastic.  You may need to wear a knee brace to:  Relieve knee pain.  Help your knee support your weight (improve stability).  Help you walk farther (improve mobility).  Prevent injury.  Support your knee while it heals from surgery or from an injury. RISKS AND COMPLICATIONS Generally, knee braces are very safe to wear. However, problems may occur, including:  Skin irritation that may lead to infection.  Making your condition worse if you wear the brace in the wrong way. HOW TO USE A KNEE BRACE Different braces will have different instructions for use. Your health care provider will tell you or show you:  How to put on your brace.  How to adjust the brace.  When and how often to wear the brace.  How to remove the brace.  If you will need any assistive devices in addition to the brace, such as crutches or a cane. In general, your brace should:  Have the hinge of the brace line up with the bend of your knee.  Have straps, hooks, or tapes that fasten snugly around your leg.  Not feel too tight or too loose. HOW TO CARE FOR A KNEE BRACE  Check your brace often for signs of damage, such as loose connections or attachments. Your knee brace may get damaged or wear out during normal use.  Wash the fabric parts of your brace with soap and water.  Read the insert that comes with your brace for other specific care  instructions. SEEK MEDICAL CARE IF:  Your knee brace is too loose or too tight and you cannot adjust it.  Your knee brace causes skin redness, swelling, bruising, or irritation.  Your knee brace is not helping.  Your knee brace is making your knee pain worse.   This information is not intended to replace advice given to you by your health care provider. Make sure you discuss any questions you have with your health care provider.   Document Released: 03/26/2003 Document Revised: 09/24/2014 Document Reviewed: 04/28/2014 Elsevier Interactive Patient Education 2016 Else[employer] Inc[employer]  Knee Fracture, Adult A knee fracture is a break in a bone of the knee. The break may be in the kneecap (patella), the lower part of the thigh bone (femur), or the upper part of the shin bone (tibia). There are several types of fractures. They include:  Stable. In this type of fracture, the bones of the knee remain in place after the break.  Displaced. In this type of fracture, the bones no longer line up after the break.  Comminuted. In this type of fracture, the bone breaks into several pieces.  Open. In this type of fracture, the broken bone comes through the skin. CAUSES This injury is usually caused by a fall. This injury can happen because of the impact of the fall or from a violent contraction of the leg muscles before you hit the ground. It  can also result from a car accident or a collision with a hard surface. RISK FACTORS This injury is more likely to develop in people who:  Are female.  Are 60-61 years old.  Participate in high-energy sports.  Have a condition that weakens the bones, such as osteoporosis.  Have had a knee replacement. SYMPTOMS Symptoms of this injury include:  Pain.  Swelling.  Bruising.  Inability to bend your knee.  Misshapen knee.  Inability to walk.  Inability to use your injured leg to support your body weight. DIAGNOSIS This injury is diagnosed with a  physical exam. Your health care provider may also order:  Imaging studies, such as an X-ray, CT scan, MRI scan, or ultrasound.  A procedure called arthroscopy to view the inside of your knee with a small camera. TREATMENT Treatment for this injury may involve:  Wearing a splint until swelling goes down.  Wearing a cast to keep the fractured bone from moving while it heals. A cast is usually put on after swelling has gone down.  Surgery to move a bone back into place. HOME CARE INSTRUCTIONS If You Have a Splint:  Wear it as directed by your health care provider. Remove it only as directed by your health care provider.  Loosen the splint if your toes become numb and tingle, or if they turn cold and blue.  Do not put pressure on any part of your splint. If You Have a Cast:  Do not stick anything inside the cast to scratch your skin. Doing that increases your risk of infection.  Check the skin around the cast every day. Report any concerns to your health care provider. You may put lotion on dry skin around the edges of the cast. Do not apply lotion to the skin underneath the cast. Bathing  Cover the cast or splint with a watertight plastic bag to protect it from water while you take a bath or a shower. Do not let the cast or splint get wet. Managing Pain, Stiffness, and Swelling  If directed, apply ice to the injured area:  Put ice in a plastic bag.  Place a towel between your skin and the bag.  Leave the ice on for 20 minutes, 2-3 times a day.  Move your toes often to avoid stiffness and to lessen swelling.  Raise the injured area above the level of your heart while you are lying down. Driving  Do not drive or operate heavy machinery while taking pain medicine.  Do not drive while wearing a cast or splint on a hand or foot that you use for driving. Activity  Return to your normal activities as directed by your health care provider. Ask your health care provider what  activities are safe for you. General Instructions  Do not put pressure on any part of the cast or splint until it is fully hardened. This may take several hours.  Keep the cast or splint clean and dry.  Do not use any tobacco products, including cigarettes, chewing tobacco, or electronic cigarettes. Tobacco can delay bone healing. If you need help quitting, ask your health care provider.  Take medicines only as directed by your health care provider.  Keep all follow-up visits as directed by your health care provider. This is important. SEEK MEDICAL CARE IF:  You have knee pain and swelling.  You have trouble walking.  Your cast becomes wet or damaged or suddenly feels too tight. SEEK IMMEDIATE MEDICAL CARE IF:  Your pain and swelling  get worse.  You have severe pain below the fracture.  Your skin or toenails turn blue or gray, feel cold, or become numb.  You have fluid, blood, or pus coming from under your cast.   This information is not intended to replace advice given to you by your health care provider. Make sure you discuss any questions you have with your health care provider.   Document Released: 11/16/2005 Document Revised: 01/24/2014 Document Reviewed: 09/04/2013 Elsevier Interactive Patient Education 2016 Else[employer] Inc. Combined Knee Ligament Sprain Combined knee ligament sprain is a tear of more than one of the major ligaments of the knee. The four knee ligaments are the anterior cruciate ligament (ACL), posterior cruciate ligament (PCL), medial collateral ligament (MCL) and lateral collateral ligament (LCL). Ligaments connect bones. They often cross a joint to hold the bones together. The ligaments of the knee keep the thigh bone (femur) and shinbone (tibia) in alignment. These ligaments allow the joint to move within a certain range of motion. Movement outside this range causes a ligament strain. Injury to multiple ligaments at the same time results in difficulty playing  sports and in daily living. The most common multiple knee ligament injury involves the ACL and MCL. SYMPTOMS   A "popping" sound heard or felt at the time of injury.  Inability to continue activity after injury.  Inflammation of the knee within 6 hours after injury.  Possibly, deformity of the knee.  Inability to straighten the knee.  Feeling of the knee giving way or buckling.  Sometimes, locking of the knee, if the joint cartilage (meniscus) is injured.  Rarely, numbness, weakness, paralysis, discoloration, or coldness, due to nerve or blood vessel injury. CAUSES  Spraining of multiple ligaments occurs when a force is placed on the ligaments that exceeds their strength. This is often caused by a direct hit (trauma). It may also be caused by a non-contact injury (hyperextending the knee while twisting it).  RISK INCREASES WITH:  Contact sports (football, rugby, lacrosse). Sports that involve pivoting, jumping, cutting, or changing direction (basketball, gymnastics, soccer, volleyball). Sports on uneven ground (cross-country running, soccer).  Poor strength and/or flexibility.  Improper fitted or padded equipment. PREVENTION  Warm up and stretch properly before activity.  Maintain physical fitness:  Thigh, leg, and knee flexibility.  Muscle strength and endurance.  Learn and use proper exercise technique.  Wear proper and well fitting equipment (correct length of cleats for surface). PROGNOSIS  Without treatment, the knee will continue to give way and become vulnerable to recurring injury. Recurring injury can happen during athletics or daily living. If the injury includes damage to a nerve or artery, the chance of a poor outcome increases. Surgery is often needed to regain stability of the knee. RELATED COMPLICATIONS  Frequently recurring symptoms, including:  Knee giving way.  Joint instability.  Inflammation.  Injury to the joint cartilage (meniscus). This may  result in locking and/or swelling of the knee.  Injury to joint (articular) cartilage of the thigh bone or shinbone. This may result in arthritis of the knee.  Injury to other ligaments of the knee.  Knee stiffness (loss of knee motion).  Permanent injury to nerves (numbness, weakness, or paralysis) or arteries.  Removal (amputation) of the leg, due to nerve or artery injury. TREATMENT  Treatment first involves medicine and ice, to reduce pain and inflammation. Crutches may be advised, to decrease pain while walking. The knee may be restrained. Rehabilitation focuses on reducing swelling, regaining range of motion, and regaining  muscle control and strength. It may also include receiving proper use training, wearing a brace, and education. (Avoid sports that involve pivoting, cutting, changing direction, jumping and landing). Surgery often offers the best chance for full recovery. Surgery from combined ACL/MCL injury involves replacement (reconstruction) of the ACL. This also allows for MCL healing. Despite surgery, some athletes may never return to their prior level of competition. The ability to return to sports depends on the related injuries and demands of the sport.  MEDICATION   If pain medicine is needed, nonsteroidal anti-inflammatory medicines (aspirin and ibuprofen), or other minor pain relievers (acetaminophen), are often advised.  Do not take pain medicine for 7 days before surgery.  Stronger pain relievers may be prescribed. Use only as directed and only as much as you need.  Contact your caregiver immediately if any bleeding, stomach upset, or signs of an allergic reaction occur. COLD THERAPY  Cold treatment (icing) should be applied for 10 to 15 minutes every 2 to 3 hours for inflammation and pain, and immediately after activity that aggravates your symptoms. Use ice packs or an ice massage. SEEK MEDICAL CARE IF:   Symptoms get worse or do not improve in 6 weeks, despite  treatment.  After injury or surgery, any of the following occur:  Pain, numbness, coldness, or a blue, gray, or dark color occurs in the foot or toenails.  Increased pain, swelling, redness, drainage of fluids, or bleeding in the affected area.  Signs of infection (headache, muscle aches, dizziness, or a general ill feeling with fever).  New, unexplained symptoms develop. (Drugs used in treatment may produce side effects.)   This information is not intended to replace advice given to you by your health care provider. Make sure you discuss any questions you have with your health care provider.   Document Released: 01/03/2005 Document Revised: 03/28/2011 Document Reviewed: 03/07/2014 Elsevier Interactive Patient Education [employer]
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
## 2015-04-03 NOTE — ED Notes (Signed)
CMS intact before and after. Pt tolerated well and understood how to use the immobilizer and the crutches.

## 2015-04-09 ENCOUNTER — Other Ambulatory Visit (HOSPITAL_COMMUNITY): Payer: Self-pay | Admitting: Orthopedic Surgery

## 2015-04-09 DIAGNOSIS — M25561 Pain in right knee: Secondary | ICD-10-CM

## 2015-04-10 ENCOUNTER — Ambulatory Visit (HOSPITAL_COMMUNITY)
Admission: RE | Admit: 2015-04-10 | Discharge: 2015-04-10 | Disposition: A | Discharge status: Home / Self Care | Payer: BLUE CROSS/BLUE SHIELD | Source: Ambulatory Visit | Attending: Orthopedic Surgery | Admitting: Orthopedic Surgery

## 2015-04-10 DIAGNOSIS — M25561 Pain in right knee: Secondary | ICD-10-CM | POA: Diagnosis present

## 2015-04-10 DIAGNOSIS — M1711 Unilateral primary osteoarthritis, right knee: Secondary | ICD-10-CM | POA: Diagnosis not present

## 2015-04-14 ENCOUNTER — Ambulatory Visit (HOSPITAL_COMMUNITY): Payer: BLUE CROSS/BLUE SHIELD

## 2015-10-30 ENCOUNTER — Encounter (HOSPITAL_BASED_OUTPATIENT_CLINIC_OR_DEPARTMENT_OTHER): Payer: Self-pay | Admitting: Emergency Medicine

## 2015-10-30 ENCOUNTER — Emergency Department (HOSPITAL_BASED_OUTPATIENT_CLINIC_OR_DEPARTMENT_OTHER)
Admission: EM | Admit: 2015-10-30 | Discharge: 2015-10-30 | Disposition: A | Discharge status: Home / Self Care | Payer: BLUE CROSS/BLUE SHIELD | Attending: Emergency Medicine | Admitting: Emergency Medicine

## 2015-10-30 DIAGNOSIS — M25561 Pain in right knee: Secondary | ICD-10-CM | POA: Diagnosis not present

## 2015-10-30 DIAGNOSIS — G8929 Other chronic pain: Secondary | ICD-10-CM | POA: Diagnosis not present

## 2015-10-30 DIAGNOSIS — M1711 Unilateral primary osteoarthritis, right knee: Secondary | ICD-10-CM

## 2015-10-30 DIAGNOSIS — Z79899 Other long term (current) drug therapy: Secondary | ICD-10-CM | POA: Insufficient documentation

## 2015-10-30 DIAGNOSIS — I10 Essential (primary) hypertension: Secondary | ICD-10-CM | POA: Diagnosis not present

## 2015-10-30 HISTORY — DX: Essential (primary) hypertension: I10

## 2015-10-30 MED ORDER — MELOXICAM 7.5 MG PO TABS: 7.5000 mg | ORAL_TABLET | Freq: Every day | ORAL | 0 refills | Status: DC

## 2015-10-30 MED FILL — MELOXICAM 7.5 MG TABLET: 7.5 | 30 days supply | Qty: 30 | Fill #0

## 2015-10-30 NOTE — Discharge Instructions (Signed)
Call Dr. Pearletha ForgeHudnall, sports medicine physician, for an appointment to discuss ongoing treatment for your arthritic knee.  Try a neoprene sleeve on your right knee when working. These can be obtained at almost any sports equipment store  Take mobiconce per day each morning
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
## 2015-10-30 NOTE — ED Notes (Signed)
Pt directed to pharmacy to pick up prescription. Ambulatory with steady gait at d/c

## 2016-01-18 DIAGNOSIS — I10 Essential (primary) hypertension: Secondary | ICD-10-CM

## 2016-01-18 HISTORY — DX: Essential (primary) hypertension: I10

## 2016-08-04 ENCOUNTER — Encounter (HOSPITAL_BASED_OUTPATIENT_CLINIC_OR_DEPARTMENT_OTHER): Payer: Self-pay | Admitting: Emergency Medicine

## 2016-08-04 ENCOUNTER — Emergency Department (HOSPITAL_BASED_OUTPATIENT_CLINIC_OR_DEPARTMENT_OTHER)
Admission: EM | Admit: 2016-08-04 | Discharge: 2016-08-04 | Disposition: A | Discharge status: Home / Self Care | Payer: BLUE CROSS/BLUE SHIELD | Attending: Emergency Medicine | Admitting: Emergency Medicine

## 2016-08-04 DIAGNOSIS — R2243 Localized swelling, mass and lump, lower limb, bilateral: Secondary | ICD-10-CM | POA: Insufficient documentation

## 2016-08-04 DIAGNOSIS — Z79899 Other long term (current) drug therapy: Secondary | ICD-10-CM | POA: Insufficient documentation

## 2016-08-04 DIAGNOSIS — I10 Essential (primary) hypertension: Secondary | ICD-10-CM | POA: Insufficient documentation

## 2016-08-04 DIAGNOSIS — R600 Localized edema: Secondary | ICD-10-CM | POA: Diagnosis not present

## 2016-08-04 LAB — BASIC METABOLIC PANEL
ANION GAP: 8 (ref 5–15)
BUN: 9 mg/dL (ref 6–20)
CHLORIDE: 108 mmol/L (ref 101–111)
CO2: 25 mmol/L (ref 22–32)
Calcium: 8.7 mg/dL — ABNORMAL LOW (ref 8.9–10.3)
Creatinine, Ser: 0.8 mg/dL (ref 0.44–1.00)
GFR calc non Af Amer: >60 mL/min (ref >60)
Glucose, Bld: 97 mg/dL (ref 65–99)
POTASSIUM: 3.2 mmol/L — AB (ref 3.5–5.1)
SODIUM: 141 mmol/L (ref 135–145)

## 2016-08-04 LAB — CBC WITH DIFFERENTIAL/PLATELET
Basophils Absolute: 0 10*3/uL (ref 0.0–0.1)
Basophils Relative: 0 %
Eosinophils Absolute: 0.2 10*3/uL (ref 0.0–0.7)
Eosinophils Relative: 2 %
HCT: 35 % — ABNORMAL LOW (ref 36.0–46.0)
Hemoglobin: 12 g/dL (ref 12.0–15.0)
LYMPHS PCT: 25 %
Lymphs Abs: 2.2 10*3/uL (ref 0.7–4.0)
MCH: 25.9 pg — AB (ref 26.0–34.0)
MCHC: 34.3 g/dL (ref 30.0–36.0)
MCV: 75.4 fL — ABNORMAL LOW (ref 78.0–100.0)
Monocytes Absolute: 0.8 10*3/uL (ref 0.1–1.0)
Monocytes Relative: 10 %
NEUTROS ABS: 5.4 10*3/uL (ref 1.7–7.7)
NEUTROS PCT: 63 %
Platelets: 348 10*3/uL (ref 150–400)
RBC: 4.64 MIL/uL (ref 3.87–5.11)
RDW: 14.3 % (ref 11.5–15.5)
WBC: 8.6 10*3/uL (ref 4.0–10.5)

## 2016-08-04 MED ORDER — FUROSEMIDE 20 MG PO TABS: 20.0000 mg | ORAL_TABLET | Freq: Every day | ORAL | 0 refills | Status: DC | PRN

## 2016-08-04 NOTE — Discharge Instructions (Signed)
Keep your legs elevated.  Lasix as prescribed as needed for swelling.  Continue your blood pressure medication as previously prescribed.  Follow-up with your primary Dr. in the next week for recheck, and return to the emergency department in the meantime if symptoms significantly worsen or change.

## 2016-08-04 NOTE — ED Provider Notes (Signed)
[facility]   CSN: [id number] Arrival date & time: 08/04/16  0556     History   Chief Complaint Chief Complaint  Patient presents with  . Leg Swelling    HPI Judith Barr is a 56 y.o. female.  Patient is a 56 year old female with past mental history of hypertension presenting with complaints of bilateral lower leg pain and swelling. This is worsened over the past 2 days. She reports being on her feet for prolonged periods of time for the past 2 weeks while at work. She has a history of hypertension, but it sounds like she is intermittently compliant. She denies any chest pain or shortness of breath. She denies any fevers or chills.   The history is provided by the patient.    Past Medical History:  Diagnosis Date  . Hypertension     There are no active problems to display for this patient.   Past Surgical History:  Procedure Laterality Date  . CHOLECYSTECTOMY    . TUBAL LIGATION      OB History    No data available       Home Medications    Prior to Admission medications   Medication Sig Start Date End Date Taking? Authorizing Provider  amLODipine (NORVASC) 5 MG tablet Take 1 tablet (5 mg total) by mouth daily. 03/27/15   Linwood DibblesKnapp, Jon, MD    Family History Family History  Problem Relation Age of Onset  . Diabetes Mother   . Diabetes Sister   . Diabetes Maternal Grandmother     Social History Social History  Substance Use Topics  . Smoking status: Never Smoker  . Smokeless tobacco: Never Used  . Alcohol use No     Allergies   Patient has no known allergies.   Review of Systems Review of Systems  All other systems reviewed and are negative.    Physical Exam Updated Vital Signs BP (!) 180/107 (BP Location: Left Arm)   Pulse 70   Temp 97.8 F (36.6 C) (Oral)   Resp 18   Ht 5\' 2"  (1.575 m)   Wt 90.7 kg (200 lb)   SpO2 99%   BMI 36.58 kg/m   Physical Exam  Constitutional: She is oriented to person, place, and  time. She appears well-developed and well-nourished. No distress.  HENT:  Head: Normocephalic and atraumatic.  Neck: Normal range of motion. Neck supple.  Cardiovascular: Normal rate and regular rhythm.  Exam reveals no gallop and no friction rub.   No murmur heard. Pulmonary/Chest: Effort normal and breath sounds normal. No respiratory distress. She has no wheezes.  Abdominal: Soft. Bowel sounds are normal. She exhibits no distension. There is no tenderness.  Musculoskeletal: Normal range of motion. She exhibits edema.  There is 1+ pitting edema of both lower extremities.  Neurological: She is alert and oriented to person, place, and time.  Skin: Skin is warm and dry. She is not diaphoretic.  Nursing note and vitals reviewed.    ED Treatments / Results  Labs (all labs ordered are listed, but only abnormal results are displayed) Labs Reviewed  BASIC METABOLIC PANEL  CBC WITH DIFFERENTIAL/PLATELET    EKG  EKG Interpretation None       Radiology No results found.  Procedures Procedures (including critical care time)  Medications Ordered in ED Medications - No data to display   Initial Impression / Assessment and Plan / ED Course  I have reviewed the triage vital signs and the nursing notes.  Pertinent labs & imaging results that were available during my care of the patient were reviewed by me and considered in my medical decision making (see chart for details).  Patient presents with leg swelling that I suspect is related to dependent edema. The patient is on her feet for prolonged periods of time at work. She recently completed a 2 week stretch. She is also somewhat non-compliant with her antihypertensive medications.  Laboratory studies today reveal normal renal function and blood counts. She will be discharged with a low dose of Lasix and when necessary follow-up as needed.  Final Clinical Impressions(s) / ED Diagnoses   Final diagnoses:  None    New  Prescriptions New Prescriptions   No medications on file     Geoffery Lyons, MD 08/04/16 [phone]

## 2016-08-04 NOTE — ED Triage Notes (Signed)
Pt c/o swelling and pain to feet and legs bilaterally. Pt admits to being non-compliant with BP medication, states "it don't do no good."

## 2016-08-18 ENCOUNTER — Ambulatory Visit (INDEPENDENT_AMBULATORY_CARE_PROVIDER_SITE_OTHER): Payer: BLUE CROSS/BLUE SHIELD | Admitting: Medical

## 2016-08-18 ENCOUNTER — Encounter: Payer: Self-pay | Admitting: Medical

## 2016-08-18 VITALS — BP 148/92 | HR 61 | Ht 62.0 in | Wt 227.3 lb

## 2016-08-18 DIAGNOSIS — R358 Other polyuria: Secondary | ICD-10-CM | POA: Diagnosis not present

## 2016-08-18 DIAGNOSIS — R3589 Other polyuria: Secondary | ICD-10-CM | POA: Insufficient documentation

## 2016-08-18 DIAGNOSIS — I10 Essential (primary) hypertension: Secondary | ICD-10-CM

## 2016-08-18 DIAGNOSIS — R600 Localized edema: Secondary | ICD-10-CM

## 2016-08-18 DIAGNOSIS — R631 Polydipsia: Secondary | ICD-10-CM | POA: Insufficient documentation

## 2016-08-18 LAB — COMPREHENSIVE METABOLIC PANEL
ALT: 11 U/L (ref 6–29)
AST: 12 U/L (ref 10–35)
Albumin: 3.6 g/dL (ref 3.6–5.1)
Alkaline Phosphatase: 91 U/L (ref 33–130)
BILIRUBIN TOTAL: 0.4 mg/dL (ref 0.2–1.2)
BUN: 7 mg/dL (ref 7–25)
CHLORIDE: 107 mmol/L (ref 98–110)
CO2: 25 mmol/L (ref 20–31)
CREATININE: 0.76 mg/dL (ref 0.50–1.05)
Calcium: 8.9 mg/dL (ref 8.6–10.4)
GLUCOSE: 84 mg/dL (ref 65–99)
Potassium: 3.6 mmol/L (ref 3.5–5.3)
SODIUM: 141 mmol/L (ref 135–146)
Total Protein: 6.9 g/dL (ref 6.1–8.1)

## 2016-08-18 LAB — LIPID PANEL
CHOLESTEROL: 205 mg/dL — AB (ref <200)
HDL: 56 mg/dL (ref >50)
LDL CALC: 135 mg/dL — AB (ref <100)
Total CHOL/HDL Ratio: 3.7 Ratio (ref <5.0)
Triglycerides: 71 mg/dL (ref <150)
VLDL: 14 mg/dL (ref <30)

## 2016-08-18 LAB — CBC
HEMATOCRIT: 38 % (ref 35.0–45.0)
Hemoglobin: 12.4 g/dL (ref 11.7–15.5)
MCH: 25.3 pg — AB (ref 27.0–33.0)
MCHC: 32.6 g/dL (ref 32.0–36.0)
MCV: 77.6 fL — AB (ref 80.0–100.0)
MPV: 9.2 fL (ref 7.5–12.5)
Platelets: 381 10*3/uL (ref 140–400)
RBC: 4.9 MIL/uL (ref 3.80–5.10)
RDW: 14.9 % (ref 11.0–15.0)
WBC: 7.8 10*3/uL (ref 4.0–10.5)

## 2016-08-18 LAB — TSH: TSH: 1.23 mIU/L
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
## 2016-08-19 ENCOUNTER — Other Ambulatory Visit: Payer: Self-pay | Admitting: Medical

## 2016-08-19 LAB — HEMOGLOBIN A1C
Hgb A1c MFr Bld: 5.6 % (ref <5.7)
Mean Plasma Glucose: 114 mg/dL

## 2016-08-19 MED ORDER — AMLODIPINE BESYLATE 5 MG PO TABS: 5.0000 mg | ORAL_TABLET | Freq: Every day | ORAL | 2 refills | Status: DC

## 2016-08-19 MED ORDER — LOSARTAN POTASSIUM-HCTZ 50-12.5 MG PO TABS: 1.0000 | ORAL_TABLET | Freq: Every day | ORAL | 2 refills | Status: DC

## 2016-09-15 ENCOUNTER — Ambulatory Visit (INDEPENDENT_AMBULATORY_CARE_PROVIDER_SITE_OTHER): Payer: BLUE CROSS/BLUE SHIELD | Admitting: Medical

## 2016-09-15 ENCOUNTER — Encounter: Payer: Self-pay | Admitting: Medical

## 2016-09-15 ENCOUNTER — Ambulatory Visit
Admission: RE | Admit: 2016-09-15 | Discharge: 2016-09-15 | Disposition: A | Discharge status: Home / Self Care | Payer: BLUE CROSS/BLUE SHIELD | Source: Ambulatory Visit | Attending: Medical | Admitting: Medical

## 2016-09-15 VITALS — BP 138/82 | HR 70 | Wt 224.6 lb

## 2016-09-15 DIAGNOSIS — R0989 Other specified symptoms and signs involving the circulatory and respiratory systems: Secondary | ICD-10-CM | POA: Diagnosis not present

## 2016-09-15 DIAGNOSIS — M79604 Pain in right leg: Secondary | ICD-10-CM

## 2016-09-15 DIAGNOSIS — M7732 Calcaneal spur, left foot: Secondary | ICD-10-CM | POA: Diagnosis not present

## 2016-09-15 DIAGNOSIS — M25552 Pain in left hip: Secondary | ICD-10-CM

## 2016-09-15 DIAGNOSIS — M79605 Pain in left leg: Secondary | ICD-10-CM | POA: Diagnosis not present

## 2016-09-15 DIAGNOSIS — M25572 Pain in left ankle and joints of left foot: Secondary | ICD-10-CM

## 2016-09-15 DIAGNOSIS — M79671 Pain in right foot: Secondary | ICD-10-CM

## 2016-09-15 DIAGNOSIS — R600 Localized edema: Secondary | ICD-10-CM | POA: Diagnosis not present

## 2016-09-15 DIAGNOSIS — M19072 Primary osteoarthritis, left ankle and foot: Secondary | ICD-10-CM | POA: Diagnosis not present

## 2016-09-15 DIAGNOSIS — M25571 Pain in right ankle and joints of right foot: Secondary | ICD-10-CM | POA: Diagnosis not present

## 2016-09-15 DIAGNOSIS — M7731 Calcaneal spur, right foot: Secondary | ICD-10-CM | POA: Diagnosis not present

## 2016-09-15 DIAGNOSIS — I10 Essential (primary) hypertension: Secondary | ICD-10-CM

## 2016-09-15 DIAGNOSIS — M79672 Pain in left foot: Secondary | ICD-10-CM

## 2016-09-15 DIAGNOSIS — M1612 Unilateral primary osteoarthritis, left hip: Secondary | ICD-10-CM | POA: Diagnosis not present

## 2016-09-15 DIAGNOSIS — R002 Palpitations: Secondary | ICD-10-CM | POA: Diagnosis not present

## 2016-09-15 MED ORDER — LOSARTAN POTASSIUM-HCTZ 100-12.5 MG PO TABS: 1.0000 | ORAL_TABLET | Freq: Every day | ORAL | 1 refills | Status: DC

## 2016-09-15 MED ORDER — AMLODIPINE BESYLATE 5 MG PO TABS: 5.0000 mg | ORAL_TABLET | Freq: Every day | ORAL | 1 refills | Status: DC

## 2016-09-15 NOTE — Progress Notes (Signed)
Subjective: Chief Complaint  Patient presents with  . Follow-up    follow up , still having foot pain    Here for 42mo f/u.  I saw her as a new patient last visit.  At that visit we continued Amlodipine and added Losartan HCT.   We also added lasix.  She says her BPs are looking better, but leg swelling unchanged.     Her main concern today is 2 month hx/o significant pains in bilat ankles, feet, left leg in general and left hip.  Her boss makes her sit down at times due to her recent worse pains.  She notes calve pains as well.   Pains improve with rest.   She is a life long nonsmoker.   No recent injury or trauma.  No other aggravating or relieving factors. No other complaint.   Past Medical History:  Diagnosis Date  . Edema   . Hypertension 2018  . Obesity    No current outpatient prescriptions on file prior to visit.   No current facility-administered medications on file prior to visit.    Family History  Problem Relation Age of Onset  . Diabetes Mother   . Diabetes Sister   . Diabetes Maternal Grandmother   . Other Brother        unknown death, homeless  . Cancer Daughter        blood cancer  . Cancer Maternal Uncle   . Cancer Daughter        sickle cell  . Other Sister        gun shot    ROS as is subjective   Objective: BP 138/82   Pulse 70   Wt 224 lb 9.6 oz (101.9 kg)   SpO2 95%   BMI 41.08 kg/m   General appearance: alert, no distress, WD/WN, obese AA female Neck: supple, no lymphadenopathy, no thyromegaly, no masses, no bruits Heart: RRR, normal S1, S2, no murmurs Lungs: CTA bilaterally, no wheezes, rhonchi, or rales MSK: thickened connective tissue left medial foot below medial malleolus and tender volar bilat feet in plantar fascia,  Tender left foot in general, but worse medial anterior calcaneal area, mild tenderness of bilat calves, otherwise nontender, no other deformity Bilat pedal pulses 1+ compared to 2+ pulses otherwise in UE No obvious  edema of LE other than localized ankle puffiness left ankle Legs neurovascular intact   Assessment: Encounter Diagnoses  Name Primary?  . Left hip pain Yes  . Bilateral foot pain   . Bilateral leg pain   . Decreased pedal pulses   . Acute bilateral ankle pain   . Essential hypertension   . Morbid obesity (HCC)   . Lower extremity edema   . Palpitation     Plan: Reviewed labs from last visit HTN - c/t Amlodipine 5mg , increase to Losartan HCT 100/12.5mg  daily.  Avoid salt, discussed healthy diet, exercise and f/u Edema - no appreciable edema on exam. Stop laxix. Will send for left hip xray given hip pain.    Will send for xrays of ankles and feet given the pains and exam findings.  Likely plantar fascitis vs arthritis in feet. Will also send for ABIs as her symptoms could also be related to PVD.  There are decreased pedal pulses   Kathyann was seen today for follow-up.  Diagnoses and all orders for this visit:  Left hip pain -     DG HIP UNILAT WITH PELVIS 2-3 VIEWS LEFT; Future  Bilateral foot pain -  DG Foot Complete Left; Future -     DG Foot Complete Right; Future -     DG Ankle 2 Views Left; Future -     DG Ankle 2 Views Right; Future  Bilateral leg pain -     VAS US ABI WITH/WO TBI; Future  Decreased pedal pulses -     VAS US ABI WITH/WO TBI; Future  Acute bilateral ankle pain  Essential hypertension  Morbid obesity (HCC)  Lower extremity edema  Palpitation  Other orders -     amLODipine (NORVASC) 5 MG tablet; Take 1 tablet (5 mg total) by mouth daily. -     losartan-hydrochlorothiazide (HYZAAR) 100-12.5 MG tablet; Take 1 tablet by mouth daily.

## 2016-09-16 ENCOUNTER — Other Ambulatory Visit: Payer: Self-pay

## 2016-09-16 DIAGNOSIS — M79671 Pain in right foot: Secondary | ICD-10-CM

## 2016-09-16 DIAGNOSIS — M79672 Pain in left foot: Principal | ICD-10-CM

## 2016-09-21 ENCOUNTER — Ambulatory Visit (HOSPITAL_COMMUNITY): Payer: BLUE CROSS/BLUE SHIELD

## 2016-09-29 ENCOUNTER — Ambulatory Visit (HOSPITAL_COMMUNITY)
Admission: RE | Admit: 2016-09-29 | Discharge: 2016-09-29 | Disposition: A | Discharge status: Home / Self Care | Payer: BLUE CROSS/BLUE SHIELD | Source: Ambulatory Visit | Attending: Medical | Admitting: Medical

## 2016-09-29 DIAGNOSIS — M79604 Pain in right leg: Secondary | ICD-10-CM | POA: Insufficient documentation

## 2016-09-29 DIAGNOSIS — M79605 Pain in left leg: Secondary | ICD-10-CM | POA: Diagnosis not present

## 2016-09-29 DIAGNOSIS — R0989 Other specified symptoms and signs involving the circulatory and respiratory systems: Secondary | ICD-10-CM

## 2016-09-29 NOTE — Progress Notes (Signed)
VASCULAR LAB PRELIMINARY  ARTERIAL  ABI completed:    RIGHT    LEFT    PRESSURE WAVEFORM  PRESSURE WAVEFORM  BRACHIAL 169 Triphasic BRACHIAL 170 Triphasic  DP 171 Triphasic DP 183 Triphasic  PT 194 Triphasic PT 194 Triphasic    RIGHT LEFT  ABI 1.14 1.14   ABIs and Doppler waveforms indicate normal arterial flow at rest.  Czarina Gingras, RVS 09/29/2016, 11:40 AM  Tana Feltsobert Hairston, RVT, RDCS

## 2016-09-30 ENCOUNTER — Ambulatory Visit (INDEPENDENT_AMBULATORY_CARE_PROVIDER_SITE_OTHER): Payer: BLUE CROSS/BLUE SHIELD | Admitting: Podiatry

## 2016-09-30 ENCOUNTER — Ambulatory Visit: Payer: BLUE CROSS/BLUE SHIELD

## 2016-09-30 ENCOUNTER — Ambulatory Visit: Payer: BLUE CROSS/BLUE SHIELD | Admitting: Podiatry

## 2016-09-30 VITALS — BP 178/100 | HR 67 | Resp 16

## 2016-09-30 DIAGNOSIS — M779 Enthesopathy, unspecified: Secondary | ICD-10-CM | POA: Diagnosis not present

## 2016-09-30 DIAGNOSIS — M25571 Pain in right ankle and joints of right foot: Secondary | ICD-10-CM

## 2016-09-30 DIAGNOSIS — B351 Tinea unguium: Secondary | ICD-10-CM

## 2016-09-30 MED ORDER — TRIAMCINOLONE ACETONIDE 10 MG/ML IJ SUSP
10.0000 mg | Freq: Once | INTRAMUSCULAR | Status: AC
  Administered 2016-09-30: 10 mg

## 2016-09-30 MED ORDER — DICLOFENAC SODIUM 75 MG PO TBEC: 75.0000 mg | DELAYED_RELEASE_TABLET | Freq: Two times a day (BID) | ORAL | 2 refills | Status: DC

## 2016-09-30 NOTE — Progress Notes (Signed)
   Subjective:    Patient ID: Judith Barr, female    DOB: 1960/07/09, 56 y.o.   MRN: [id number]  HPI Chief Complaint  Patient presents with  . Ankle Pain    Bilateral; both sides; pt stated, "Dr. Aleen Campi said on 09/15/16; I broke the Right ankle; but does not remember breaking ankle"      Review of Systems  Cardiovascular: Positive for leg swelling.  Musculoskeletal: Positive for gait problem and myalgias.  All other systems reviewed and are negative.      Objective:   Physical Exam        Assessment & Plan:

## 2016-10-01 NOTE — Progress Notes (Signed)
Subjective:    Patient ID: Judith Barr, female   DOB: 56 y.o.   MRN: [id number]   HPI patient presents with significant discomfort in the ankle region bilateral moderate discomfort in the medial side of the foot bilateral and nail disease that she's concerned about. States the ankles been bothering her for a fairly long time and worse over the last few months    Review of Systems  All other systems reviewed and are negative.       Objective:  Physical Exam  Constitutional: She appears well-developed and well-nourished.  Cardiovascular: Intact distal pulses.   Pulmonary/Chest: Effort normal.  Musculoskeletal: Normal range of motion.  Neurological: She is alert.  Skin: Skin is warm.  Nursing note and vitals reviewed.  neurovascular status intact muscle strength adequate range of motion within normal limits with exquisite discomfort in the subtalar joint bilateral with inflammation fluid buildup and mild discomfort around the posterior tibial tendon bilateral with moderate depression of the arch bilateral. I did not note any loss of motion or any indications of crepitus within the joint surfaces. Patient is found to have thick and incurvated nailbeds 1-5 both feet     Assessment:   Probability for inflammatory sinus tarsitis bilateral with mild posterior tibial tendinitis and also I noted nail disease localized bilateral      Plan:     H&P conditions reviewed and today I went ahead and I injected the sinus tarsi bilateral 3 mg Kenalog 5 mg Xylocaine and instructed on fascially brace usage to stabilize the arch which was dispensed bilateral. Reappoint to recheck in 2 weeks  X-rays indicate that there is moderate depression of the arch with indications of subtalar joint arthritic issues

## 2016-10-04 ENCOUNTER — Telehealth: Payer: Self-pay | Admitting: Podiatry

## 2016-10-04 NOTE — Telephone Encounter (Signed)
Pt stated she is no longer taking Rx prescribed at her visit on 14 September. Stated after she took one pill she had to use crutches to walk around because she could not put her foot/ankle on the ground to walk. Pt stated she does not want anything else prescribed.

## 2016-10-14 ENCOUNTER — Encounter: Payer: Self-pay | Admitting: Podiatry

## 2016-10-14 ENCOUNTER — Ambulatory Visit (INDEPENDENT_AMBULATORY_CARE_PROVIDER_SITE_OTHER): Payer: BLUE CROSS/BLUE SHIELD | Admitting: Podiatry

## 2016-10-14 DIAGNOSIS — M779 Enthesopathy, unspecified: Secondary | ICD-10-CM

## 2016-10-14 DIAGNOSIS — M7750 Other enthesopathy of unspecified foot: Secondary | ICD-10-CM | POA: Diagnosis not present

## 2016-10-14 MED ORDER — TRIAMCINOLONE ACETONIDE 10 MG/ML IJ SUSP
10.0000 mg | Freq: Once | INTRAMUSCULAR | Status: AC
  Administered 2016-10-14: 10 mg

## 2016-10-14 NOTE — Progress Notes (Signed)
Subjective:    Patient ID: Judith Barr, female   DOB: 56 y.o.   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 I then recommended orthotics and patient is scanned for customized orthotic devices to lift the arch is and take stress off the arch and patient will be seen back when returned

## 2016-11-10 ENCOUNTER — Ambulatory Visit: Payer: BLUE CROSS/BLUE SHIELD | Admitting: Orthotics

## 2016-11-10 ENCOUNTER — Encounter: Payer: Self-pay | Admitting: Podiatry

## 2016-11-10 ENCOUNTER — Ambulatory Visit (INDEPENDENT_AMBULATORY_CARE_PROVIDER_SITE_OTHER): Payer: BLUE CROSS/BLUE SHIELD | Admitting: Podiatry

## 2016-11-10 DIAGNOSIS — M779 Enthesopathy, unspecified: Secondary | ICD-10-CM | POA: Diagnosis not present

## 2016-11-10 MED ORDER — TRIAMCINOLONE ACETONIDE 10 MG/ML IJ SUSP
10.0000 mg | Freq: Once | INTRAMUSCULAR | Status: AC
  Administered 2016-11-10: 10 mg

## 2016-11-10 NOTE — Progress Notes (Signed)
Patient came in today to pick up custom made foot orthotics.  The goals were accomplished and the patient reported no dissatisfaction with said orthotics.  Patient was advised of breakin period and how to report any issues. 

## 2016-11-10 NOTE — Progress Notes (Signed)
Subjective:    Patient ID: Judith McburneyEdna Barr, female   DOB: 56 y.o.   MRN: [id number]   HPI patient states she still having a lot of pain in the right foot on the side and that she's tried to make accommodations for it but it continues to be a problem with ambulation    ROS      Objective:  Physical Exam neurovascular status intact with inflammation pain in the posterior tibial tendon right with fluid buildup around the malleolus and the insertion into the navicular     Assessment:  Chronic pain in the posterior tibial tendon right which continues to bother her with any form of ambulation       Plan:    I did go ahead today did careful sheath injection right and then did go ahead and I applied air fracture walker to completely immobilize to allowed to rest and did dispensed orthotics with instructions on beginning usage in several weeks. Reappoint for us to recheck 3 weeks or earlier if needed

## 2016-11-10 NOTE — Patient Instructions (Signed)

## 2016-12-01 ENCOUNTER — Encounter: Payer: Self-pay | Admitting: Podiatry

## 2016-12-01 ENCOUNTER — Ambulatory Visit: Payer: BLUE CROSS/BLUE SHIELD | Admitting: Podiatry

## 2016-12-01 DIAGNOSIS — B351 Tinea unguium: Secondary | ICD-10-CM | POA: Diagnosis not present

## 2016-12-01 DIAGNOSIS — M79675 Pain in left toe(s): Secondary | ICD-10-CM | POA: Diagnosis not present

## 2016-12-01 DIAGNOSIS — M79674 Pain in right toe(s): Secondary | ICD-10-CM | POA: Diagnosis not present

## 2016-12-01 DIAGNOSIS — M779 Enthesopathy, unspecified: Secondary | ICD-10-CM

## 2016-12-01 NOTE — Progress Notes (Signed)
Subjective:    Patient ID: Judith McburneyEdna Barr, female   DOB: 56 y.o.   MRN: [id number]   HPI patient presents stating she is improved in her right ankle still having some occasional soreness and her nails are very thick she cannot cut them they're painful and she needs them taken care of    ROS      Objective:  Physical Exam neurovascular status intact with tendinitis of the right medial ankle that's improved but still present with patient due to begin orthotics and mycotic thick yellow brittle nailbeds 1-5 both feet that are painful     Assessment:    Depressed arch with tendinitis of the right posterior tibial tendon and mycotic nail infection 1-5 both feet     Plan:    H&P condition reviewed and debrided nailbeds 1-5 both feet with no iatrogenic bleeding and went ahead and start orthotics to try to control tendinitis and tried not to wear the boot as best as possible

## 2016-12-29 ENCOUNTER — Ambulatory Visit: Payer: BLUE CROSS/BLUE SHIELD | Admitting: Podiatry

## 2016-12-29 ENCOUNTER — Encounter: Payer: Self-pay | Admitting: Podiatry

## 2016-12-29 DIAGNOSIS — M779 Enthesopathy, unspecified: Secondary | ICD-10-CM | POA: Diagnosis not present

## 2016-12-29 DIAGNOSIS — B351 Tinea unguium: Secondary | ICD-10-CM | POA: Diagnosis not present

## 2016-12-29 MED ORDER — DICLOFENAC SODIUM 75 MG PO TBEC
75.0000 mg | DELAYED_RELEASE_TABLET | Freq: Two times a day (BID) | ORAL | 2 refills | Status: DC
Start: 2016-12-29 — End: 2020-04-06

## 2016-12-29 NOTE — Progress Notes (Signed)
Subjective:   Patient ID: Judith McburneyEdna Barr, female   DOB: 56 y.o.   MRN: [id number]   HPI Patient presents stating my foot feels a little better but I still get pain but the orthotics really seem to be helping me.   ROS      Objective:  Physical Exam  Neurovascular status intact with continued collapse of medial longitudinal arch bilateral with posterior tibial tendon that is functioning but is still inflamed upon ambulation.     Assessment:  Posterior tibial tendinitis still present with improvement but still painful.     Plan:  Advised on the importance of orthotics reviewed continued physical therapy supportive shoes and try not to go barefoot.  Patient will be seen back as needed and will gradually increase activity levels

## 2017-02-02 ENCOUNTER — Ambulatory Visit: Payer: BLUE CROSS/BLUE SHIELD | Admitting: Podiatry

## 2017-02-02 ENCOUNTER — Encounter: Payer: Self-pay | Admitting: Podiatry

## 2017-02-02 DIAGNOSIS — M779 Enthesopathy, unspecified: Secondary | ICD-10-CM | POA: Diagnosis not present

## 2017-02-02 MED ORDER — TRIAMCINOLONE ACETONIDE 10 MG/ML IJ SUSP
10.0000 mg | Freq: Once | INTRAMUSCULAR | Status: AC
  Administered 2017-02-02: 10 mg

## 2017-02-02 NOTE — Progress Notes (Signed)
Subjective:   Patient ID: Judith McburneyEdna Barr, female   DOB: 57 y.o.   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  I injected the sinus tarsi right 3 mg Kenalog 5 mg Xylocaine and advised on continued anti-inflammatory usage and reappoint to recheck

## 2017-02-09 ENCOUNTER — Ambulatory Visit: Payer: BLUE CROSS/BLUE SHIELD | Admitting: Podiatry
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
## 2017-12-18 ENCOUNTER — Other Ambulatory Visit: Payer: Self-pay | Admitting: Podiatry

## 2017-12-18 ENCOUNTER — Ambulatory Visit (INDEPENDENT_AMBULATORY_CARE_PROVIDER_SITE_OTHER): Payer: BLUE CROSS/BLUE SHIELD

## 2017-12-18 ENCOUNTER — Encounter: Payer: Self-pay | Admitting: Podiatry

## 2017-12-18 ENCOUNTER — Ambulatory Visit: Payer: BLUE CROSS/BLUE SHIELD | Admitting: Podiatry

## 2017-12-18 DIAGNOSIS — M79675 Pain in left toe(s): Secondary | ICD-10-CM

## 2017-12-18 DIAGNOSIS — M779 Enthesopathy, unspecified: Secondary | ICD-10-CM

## 2017-12-18 DIAGNOSIS — M79674 Pain in right toe(s): Secondary | ICD-10-CM | POA: Diagnosis not present

## 2017-12-18 DIAGNOSIS — B351 Tinea unguium: Secondary | ICD-10-CM | POA: Diagnosis not present

## 2017-12-18 MED ORDER — TRIAMCINOLONE ACETONIDE 10 MG/ML IJ SUSP
10.0000 mg | Freq: Once | INTRAMUSCULAR | Status: AC
  Administered 2017-12-18: 10 mg

## 2017-12-18 MED ORDER — PREDNISONE 10 MG PO TABS: ORAL_TABLET | ORAL | 0 refills | Status: DC

## 2017-12-19 NOTE — Progress Notes (Signed)
Subjective:   Patient ID: Judith Barr, female   DOB: 57 y.o.   MRN: [id number]   HPI Patient presents stating that her right ankle has really been bothering her worse than the left and it is hard for her to walk.  States that the injections helped for a period of time but not long and she has a lot of inflammation currently but was not able to get in as she should have.  Also states that her nails have been very sore and she cannot cut them and they get ingrown in the corners   ROS      Objective:  Physical Exam  Neurovascular status intact with inflammation pain of the sinus tarsi bilateral with fluid buildup with the right being worse than the left and reduced range of motion of the subtalar joint bilateral.  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  Mycotic nail infection 1-5 both feet with pain     Plan:  Reviewed all conditions and discussed CT scans in the future.  At this time we will try more aggressive conservative approach and I injected the sinus tarsi bilateral applied air fracture walker right to mobilize and take all pressure off the joint and placed on Deltasone 12-day Dosepak.  Debrided nailbeds 1-5 both feet will see back again and if symptoms continue to persist or not improve order to get CT scans with possibility for subtalar joint fusion for this patient  X-rays indicate flattening of the subtalar joint middle facet right over left with indications of arthritic processes occurring

## 2018-01-01 ENCOUNTER — Encounter: Payer: Self-pay | Admitting: Podiatry

## 2018-01-01 ENCOUNTER — Ambulatory Visit: Payer: BLUE CROSS/BLUE SHIELD | Admitting: Podiatry

## 2018-01-01 DIAGNOSIS — M7751 Other enthesopathy of right foot: Secondary | ICD-10-CM

## 2018-01-01 DIAGNOSIS — M19079 Primary osteoarthritis, unspecified ankle and foot: Secondary | ICD-10-CM

## 2018-01-01 DIAGNOSIS — M779 Enthesopathy, unspecified: Secondary | ICD-10-CM

## 2018-01-01 MED ORDER — TRIAMCINOLONE ACETONIDE 10 MG/ML IJ SUSP
10.0000 mg | Freq: Once | INTRAMUSCULAR | Status: AC
  Administered 2018-01-01: 10 mg

## 2018-01-01 NOTE — Progress Notes (Signed)
Subjective:   Patient ID: Judith Barr, female   DOB: [age] y.o.   MRN: [id number]   HPI Patient presents stating she still having a lot of pain in her right ankle and it is making it increasingly difficult for her to be active.  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

## 2018-01-15 ENCOUNTER — Ambulatory Visit (HOSPITAL_COMMUNITY)
Admission: RE | Admit: 2018-01-15 | Discharge: 2018-01-15 | Disposition: A | Discharge status: Home / Self Care | Payer: BLUE CROSS/BLUE SHIELD | Source: Ambulatory Visit | Attending: Podiatry | Admitting: Podiatry

## 2018-01-15 ENCOUNTER — Encounter (HOSPITAL_COMMUNITY): Payer: Self-pay

## 2018-01-15 ENCOUNTER — Telehealth: Payer: Self-pay | Admitting: *Deleted

## 2018-01-15 DIAGNOSIS — M19079 Primary osteoarthritis, unspecified ankle and foot: Secondary | ICD-10-CM

## 2018-01-15 DIAGNOSIS — M19071 Primary osteoarthritis, right ankle and foot: Secondary | ICD-10-CM | POA: Diagnosis not present

## 2018-01-15 NOTE — Telephone Encounter (Signed)
[name] - [facility] states pt is on the table and states the ankle is the painful area, but orders say foot. I read the dx information and Objective and Assessment of 01/01/2018 to [name] and she thanked me for my help.

## 2018-04-28 ENCOUNTER — Encounter (HOSPITAL_BASED_OUTPATIENT_CLINIC_OR_DEPARTMENT_OTHER): Payer: Self-pay | Admitting: Emergency Medicine

## 2018-04-28 ENCOUNTER — Emergency Department (HOSPITAL_BASED_OUTPATIENT_CLINIC_OR_DEPARTMENT_OTHER)
Admission: EM | Admit: 2018-04-28 | Discharge: 2018-04-28 | Disposition: A | Discharge status: Home / Self Care | Payer: BLUE CROSS/BLUE SHIELD | Attending: Emergency Medicine | Admitting: Emergency Medicine

## 2018-04-28 ENCOUNTER — Other Ambulatory Visit: Payer: Self-pay

## 2018-04-28 DIAGNOSIS — I10 Essential (primary) hypertension: Secondary | ICD-10-CM | POA: Diagnosis not present

## 2018-04-28 DIAGNOSIS — M25572 Pain in left ankle and joints of left foot: Secondary | ICD-10-CM

## 2018-04-28 DIAGNOSIS — Z79899 Other long term (current) drug therapy: Secondary | ICD-10-CM | POA: Diagnosis not present

## 2018-04-28 MED ORDER — MELOXICAM 15 MG PO TABS: 15.0000 mg | ORAL_TABLET | Freq: Every day | ORAL | 0 refills | Status: DC

## 2018-04-28 MED ORDER — PREDNISONE 20 MG PO TABS: 40.0000 mg | ORAL_TABLET | Freq: Every day | ORAL | 0 refills | Status: DC

## 2018-04-28 MED FILL — MELOXICAM 15 MG TABLET: 15 | 7 days supply | Qty: 7 | Fill #0

## 2018-04-28 MED FILL — predniSONE 20 MG TABS: 20 | 10 days supply | Qty: 10 | Fill #0

## 2018-04-28 NOTE — ED Notes (Signed)
ED Provider at bedside. 

## 2018-04-28 NOTE — ED Triage Notes (Addendum)
Reports left ankle pain that began while at work yesterday.  Denies injury.  Reports previous problems with ankle in the past.

## 2018-04-29 NOTE — ED Provider Notes (Signed)
[facility] EMERGENCY DEPARTMENT Provider Note   CSN: [id number] Arrival date & time: 04/28/18  0754    History   Chief Complaint Chief Complaint  Patient presents with   Ankle Pain    HPI Judith Barr is a 58 y.o. female.     HPI   58 year old female with left ankle pain.  Began while at work yesterday.  Denies any acute trauma or strain.  Pain is constant.  Worse with movement and standing on it.  No swelling.  No numbness or tingling.  Denies any significant acute pain in the other joints.  No fevers or chills.  Past Medical History:  Diagnosis Date   Edema    Hypertension 2018   Obesity     Patient Active Problem List   Diagnosis Date Noted   Left hip pain 09/15/2016   Bilateral leg pain 09/15/2016   Decreased pedal pulses 09/15/2016   Acute bilateral ankle pain 09/15/2016   Palpitation 09/15/2016   Morbid obesity (HCC) 08/18/2016   Polydipsia 08/18/2016   Polyuria 08/18/2016   Essential hypertension 08/18/2016   Lower extremity edema 08/18/2016   IIH (idiopathic intracranial hypertension) 03/31/2014   Posterior subcapsular cataract, left 03/31/2014    Past Surgical History:  Procedure Laterality Date   CHOLECYSTECTOMY     TUBAL LIGATION       OB History   No obstetric history on file.      Home Medications    Prior to Admission medications   Medication Sig Start Date End Date Taking? Authorizing Provider  acetaZOLAMIDE (DIAMOX) 250 MG tablet  02/24/14   [provider]  amLODipine (NORVASC) 5 MG tablet Take 1 tablet (5 mg total) by mouth daily. 09/15/16   Tysinger, Kermit Baloavid S, PA-C  butalbital-acetaminophen-caffeine (FIORICET, ESGIC) [phone] MG tablet Take by mouth. 01/10/15   [provider]  diclofenac (VOLTAREN) 75 MG EC tablet Take 1 tablet (75 mg total) by mouth 2 (two) times daily. 09/30/16   Lenn Sinkegal, Norman S, DPM  diclofenac (VOLTAREN) 75 MG EC tablet Take 1 tablet (75 mg total) by mouth 2 (two) times  daily. 12/29/16   Lenn Sinkegal, Norman S, DPM  losartan-hydrochlorothiazide (HYZAAR) 100-12.5 MG tablet Take 1 tablet by mouth daily. 09/15/16   Tysinger, Kermit Baloavid S, PA-C  meloxicam (MOBIC) 15 MG tablet Take 1 tablet (15 mg total) by mouth daily. 04/28/18   Raeford RazorKohut, Tiffanye Hartmann, MD  predniSONE (DELTASONE) 20 MG tablet Take 2 tablets (40 mg total) by mouth daily. 04/28/18   Raeford RazorKohut, Shayra Anton, MD    Family History Family History  Problem Relation Age of Onset   Diabetes Mother    Diabetes Sister    Diabetes Maternal Grandmother    Other Brother        unknown death, homeless   Cancer Daughter        blood cancer   Cancer Maternal Uncle    Cancer Daughter        sickle cell   Other Sister        gun shot    Social History Social History   Tobacco Use   Smoking status: Never Smoker   Smokeless tobacco: Never Used  Substance Use Topics   Alcohol use: No    Alcohol/week: 0.0 standard drinks   Drug use: No     Allergies   Patient has no known allergies.   Review of Systems Review of Systems  All systems reviewed and negative, other than as noted in HPI.  Physical Exam Updated Vital  Signs BP (!) 181/94 (BP Location: Right Arm)    Pulse (!) 59    Temp 97.8 F (36.6 C) (Oral)    Resp 16    Ht 5\' 2"  (1.575 m)    Wt 90.7 kg    SpO2 99%    BMI 36.58 kg/m   Physical Exam Vitals signs and nursing note reviewed.  Constitutional:      General: She is not in acute distress.    Appearance: She is well-developed.  HENT:     Head: Normocephalic and atraumatic.  Eyes:     General:        Right eye: No discharge.        Left eye: No discharge.     Conjunctiva/sclera: Conjunctivae normal.  Neck:     Musculoskeletal: Neck supple.  Cardiovascular:     Rate and Rhythm: Normal rate and regular rhythm.     Heart sounds: Normal heart sounds. No murmur. No friction rub. No gallop.   Pulmonary:     Effort: Pulmonary effort is normal. No respiratory distress.     Breath sounds: Normal  breath sounds.  Abdominal:     General: There is no distension.     Palpations: Abdomen is soft.     Tenderness: There is no abdominal tenderness.  Musculoskeletal:        General: No tenderness.     Comments: Left ankle grossly normal appearance and symmetric as compared to the left.  No effusion.  No concerning skin changes.  Dopplerable DP pulse.  Sensation intact to light touch.  Reports increased pain with range of motion.  She can actively range it.  Skin:    General: Skin is warm and dry.  Neurological:     Mental Status: She is alert.  Psychiatric:        Behavior: Behavior normal.        Thought Content: Thought content normal.      ED Treatments / Results  Labs (all labs ordered are listed, but only abnormal results are displayed) Labs Reviewed - No data to display  EKG None  Radiology No results found.  Procedures Procedures (including critical care time)  Medications Ordered in ED Medications - No data to display   Initial Impression / Assessment and Plan / ED Course  I have reviewed the triage vital signs and the nursing notes.  Pertinent labs & imaging results that were available during my care of the patient were reviewed by me and considered in my medical decision making (see chart for details).        58 year old female with atraumatic left ankle pain.  Imaging of likely little utility.  Physical exam pretty unremarkable.  Plan a course of anti-inflammatories.  Try to rest, otherwise activity as tolerated.  Return precautions discussed.  Outpatient follow-up otherwise. Final Clinical Impressions(s) / ED Diagnoses   Final diagnoses:  Acute left ankle pain    ED Discharge Orders         Ordered    predniSONE (DELTASONE) 20 MG tablet  Daily     04/28/18 0812    meloxicam (MOBIC) 15 MG tablet  Daily     04/28/18 6811           Raeford Razor, MD 04/29/18 1635

## 2018-06-12 ENCOUNTER — Telehealth: Payer: Self-pay

## 2018-06-12 NOTE — Telephone Encounter (Signed)
Left message on voicemail for patient to call back to schedule Fasting CPE.   

## 2018-09-10 DIAGNOSIS — U071 COVID-19: Secondary | ICD-10-CM | POA: Diagnosis not present

## 2018-09-19 DIAGNOSIS — U071 COVID-19: Secondary | ICD-10-CM | POA: Diagnosis not present

## 2018-09-26 DIAGNOSIS — U071 COVID-19: Secondary | ICD-10-CM | POA: Diagnosis not present

## 2018-10-03 DIAGNOSIS — U071 COVID-19: Secondary | ICD-10-CM | POA: Diagnosis not present

## 2018-10-10 DIAGNOSIS — Z20828 Contact with and (suspected) exposure to other viral communicable diseases: Secondary | ICD-10-CM | POA: Diagnosis not present

## 2018-10-10 DIAGNOSIS — U071 COVID-19: Secondary | ICD-10-CM | POA: Diagnosis not present

## 2018-10-17 DIAGNOSIS — U071 COVID-19: Secondary | ICD-10-CM | POA: Diagnosis not present

## 2018-10-17 DIAGNOSIS — Z20828 Contact with and (suspected) exposure to other viral communicable diseases: Secondary | ICD-10-CM | POA: Diagnosis not present

## 2018-10-24 DIAGNOSIS — Z20828 Contact with and (suspected) exposure to other viral communicable diseases: Secondary | ICD-10-CM | POA: Diagnosis not present

## 2018-10-31 DIAGNOSIS — U071 COVID-19: Secondary | ICD-10-CM | POA: Diagnosis not present

## 2018-10-31 DIAGNOSIS — Z20828 Contact with and (suspected) exposure to other viral communicable diseases: Secondary | ICD-10-CM | POA: Diagnosis not present

## 2018-11-07 DIAGNOSIS — U071 COVID-19: Secondary | ICD-10-CM | POA: Diagnosis not present

## 2018-11-14 DIAGNOSIS — Z20828 Contact with and (suspected) exposure to other viral communicable diseases: Secondary | ICD-10-CM | POA: Diagnosis not present

## 2018-11-14 DIAGNOSIS — U071 COVID-19: Secondary | ICD-10-CM | POA: Diagnosis not present

## 2018-11-22 DIAGNOSIS — U071 COVID-19: Secondary | ICD-10-CM | POA: Diagnosis not present

## 2018-11-22 DIAGNOSIS — Z20828 Contact with and (suspected) exposure to other viral communicable diseases: Secondary | ICD-10-CM | POA: Diagnosis not present

## 2018-11-28 DIAGNOSIS — Z20828 Contact with and (suspected) exposure to other viral communicable diseases: Secondary | ICD-10-CM | POA: Diagnosis not present

## 2018-11-28 DIAGNOSIS — U071 COVID-19: Secondary | ICD-10-CM | POA: Diagnosis not present

## 2018-12-05 DIAGNOSIS — Z20828 Contact with and (suspected) exposure to other viral communicable diseases: Secondary | ICD-10-CM | POA: Diagnosis not present

## 2018-12-05 DIAGNOSIS — U071 COVID-19: Secondary | ICD-10-CM | POA: Diagnosis not present

## 2018-12-12 DIAGNOSIS — Z20828 Contact with and (suspected) exposure to other viral communicable diseases: Secondary | ICD-10-CM | POA: Diagnosis not present

## 2018-12-19 DIAGNOSIS — Z20828 Contact with and (suspected) exposure to other viral communicable diseases: Secondary | ICD-10-CM | POA: Diagnosis not present

## 2018-12-19 DIAGNOSIS — U071 COVID-19: Secondary | ICD-10-CM | POA: Diagnosis not present

## 2018-12-24 DIAGNOSIS — U071 COVID-19: Secondary | ICD-10-CM | POA: Diagnosis not present

## 2018-12-24 DIAGNOSIS — Z20828 Contact with and (suspected) exposure to other viral communicable diseases: Secondary | ICD-10-CM | POA: Diagnosis not present

## 2019-01-01 DIAGNOSIS — U071 COVID-19: Secondary | ICD-10-CM | POA: Diagnosis not present

## 2019-01-01 DIAGNOSIS — Z20828 Contact with and (suspected) exposure to other viral communicable diseases: Secondary | ICD-10-CM | POA: Diagnosis not present

## 2019-01-07 DIAGNOSIS — Z20828 Contact with and (suspected) exposure to other viral communicable diseases: Secondary | ICD-10-CM | POA: Diagnosis not present

## 2019-01-14 DIAGNOSIS — U071 COVID-19: Secondary | ICD-10-CM | POA: Diagnosis not present

## 2019-01-14 DIAGNOSIS — Z20828 Contact with and (suspected) exposure to other viral communicable diseases: Secondary | ICD-10-CM | POA: Diagnosis not present

## 2019-01-17 DIAGNOSIS — U071 COVID-19: Secondary | ICD-10-CM | POA: Diagnosis not present

## 2019-01-17 DIAGNOSIS — Z20828 Contact with and (suspected) exposure to other viral communicable diseases: Secondary | ICD-10-CM | POA: Diagnosis not present

## 2019-01-21 DIAGNOSIS — Z20828 Contact with and (suspected) exposure to other viral communicable diseases: Secondary | ICD-10-CM | POA: Diagnosis not present

## 2019-01-25 DIAGNOSIS — U071 COVID-19: Secondary | ICD-10-CM | POA: Diagnosis not present

## 2019-01-28 DIAGNOSIS — Z20828 Contact with and (suspected) exposure to other viral communicable diseases: Secondary | ICD-10-CM | POA: Diagnosis not present

## 2019-01-31 DIAGNOSIS — Z20828 Contact with and (suspected) exposure to other viral communicable diseases: Secondary | ICD-10-CM | POA: Diagnosis not present

## 2019-01-31 DIAGNOSIS — U071 COVID-19: Secondary | ICD-10-CM | POA: Diagnosis not present

## 2019-02-05 DIAGNOSIS — Z20828 Contact with and (suspected) exposure to other viral communicable diseases: Secondary | ICD-10-CM | POA: Diagnosis not present

## 2019-02-07 DIAGNOSIS — Z20828 Contact with and (suspected) exposure to other viral communicable diseases: Secondary | ICD-10-CM | POA: Diagnosis not present

## 2019-02-07 DIAGNOSIS — U071 COVID-19: Secondary | ICD-10-CM | POA: Diagnosis not present

## 2019-02-11 DIAGNOSIS — U071 COVID-19: Secondary | ICD-10-CM | POA: Diagnosis not present

## 2019-02-15 DIAGNOSIS — Z20828 Contact with and (suspected) exposure to other viral communicable diseases: Secondary | ICD-10-CM | POA: Diagnosis not present

## 2019-02-15 DIAGNOSIS — U071 COVID-19: Secondary | ICD-10-CM | POA: Diagnosis not present

## 2019-02-18 DIAGNOSIS — Z20828 Contact with and (suspected) exposure to other viral communicable diseases: Secondary | ICD-10-CM | POA: Diagnosis not present

## 2019-02-18 DIAGNOSIS — U071 COVID-19: Secondary | ICD-10-CM | POA: Diagnosis not present

## 2019-02-22 DIAGNOSIS — U071 COVID-19: Secondary | ICD-10-CM | POA: Diagnosis not present

## 2019-02-22 DIAGNOSIS — Z20828 Contact with and (suspected) exposure to other viral communicable diseases: Secondary | ICD-10-CM | POA: Diagnosis not present

## 2019-02-25 DIAGNOSIS — Z20828 Contact with and (suspected) exposure to other viral communicable diseases: Secondary | ICD-10-CM | POA: Diagnosis not present

## 2019-02-25 DIAGNOSIS — U071 COVID-19: Secondary | ICD-10-CM | POA: Diagnosis not present

## 2019-03-01 DIAGNOSIS — U071 COVID-19: Secondary | ICD-10-CM | POA: Diagnosis not present

## 2019-03-01 DIAGNOSIS — Z20828 Contact with and (suspected) exposure to other viral communicable diseases: Secondary | ICD-10-CM | POA: Diagnosis not present

## 2019-03-04 DIAGNOSIS — Z20828 Contact with and (suspected) exposure to other viral communicable diseases: Secondary | ICD-10-CM | POA: Diagnosis not present

## 2019-03-04 DIAGNOSIS — U071 COVID-19: Secondary | ICD-10-CM | POA: Diagnosis not present

## 2019-03-12 DIAGNOSIS — U071 COVID-19: Secondary | ICD-10-CM | POA: Diagnosis not present

## 2019-03-12 DIAGNOSIS — Z20828 Contact with and (suspected) exposure to other viral communicable diseases: Secondary | ICD-10-CM | POA: Diagnosis not present

## 2019-03-18 DIAGNOSIS — U071 COVID-19: Secondary | ICD-10-CM | POA: Diagnosis not present

## 2019-03-18 DIAGNOSIS — Z20828 Contact with and (suspected) exposure to other viral communicable diseases: Secondary | ICD-10-CM | POA: Diagnosis not present

## 2019-03-25 DIAGNOSIS — Z20828 Contact with and (suspected) exposure to other viral communicable diseases: Secondary | ICD-10-CM | POA: Diagnosis not present

## 2019-03-25 DIAGNOSIS — U071 COVID-19: Secondary | ICD-10-CM | POA: Diagnosis not present

## 2019-04-02 DIAGNOSIS — U071 COVID-19: Secondary | ICD-10-CM | POA: Diagnosis not present

## 2019-04-02 DIAGNOSIS — Z20828 Contact with and (suspected) exposure to other viral communicable diseases: Secondary | ICD-10-CM | POA: Diagnosis not present

## 2019-04-09 DIAGNOSIS — U071 COVID-19: Secondary | ICD-10-CM | POA: Diagnosis not present

## 2019-04-09 DIAGNOSIS — Z20828 Contact with and (suspected) exposure to other viral communicable diseases: Secondary | ICD-10-CM | POA: Diagnosis not present

## 2020-04-06 ENCOUNTER — Ambulatory Visit: Payer: BC Managed Care – PPO | Admitting: Medical

## 2020-04-06 ENCOUNTER — Other Ambulatory Visit: Payer: Self-pay

## 2020-04-06 ENCOUNTER — Encounter: Payer: Self-pay | Admitting: Medical

## 2020-04-06 VITALS — BP 184/126 | HR 78 | Ht 62.0 in | Wt 215.6 lb

## 2020-04-06 DIAGNOSIS — I160 Hypertensive urgency: Secondary | ICD-10-CM | POA: Diagnosis not present

## 2020-04-06 DIAGNOSIS — Z9119 Patient's noncompliance with other medical treatment and regimen: Secondary | ICD-10-CM

## 2020-04-06 DIAGNOSIS — Z91199 Patient's noncompliance with other medical treatment and regimen due to unspecified reason: Secondary | ICD-10-CM | POA: Insufficient documentation

## 2020-04-06 LAB — CBC
Hematocrit: 36.4 % (ref 34.0–46.6)
Hemoglobin: 12.2 g/dL (ref 11.1–15.9)
MCH: 26.1 pg — ABNORMAL LOW (ref 26.6–33.0)
MCHC: 33.5 g/dL (ref 31.5–35.7)
MCV: 78 fL — ABNORMAL LOW (ref 79–97)
Platelets: 309 10*3/uL (ref 150–450)
RBC: 4.68 x10E6/uL (ref 3.77–5.28)
RDW: 14.3 % (ref 11.7–15.4)
WBC: 7.5 10*3/uL (ref 3.4–10.8)

## 2020-04-06 LAB — BASIC METABOLIC PANEL
BUN/Creatinine Ratio: 15 (ref 9–23)
BUN: 12 mg/dL (ref 6–24)
CO2: 27 mmol/L (ref 20–29)
Calcium: 9.1 mg/dL (ref 8.7–10.2)
Chloride: 108 mmol/L — ABNORMAL HIGH (ref 96–106)
Creatinine, Ser: 0.82 mg/dL (ref 0.57–1.00)
Glucose: 94 mg/dL (ref 65–99)
Potassium: 3.7 mmol/L (ref 3.5–5.2)
Sodium: 141 mmol/L (ref 134–144)
eGFR: 83 mL/min/{1.73_m2} (ref >59)

## 2020-04-06 LAB — POCT URINALYSIS DIP (PROADVANTAGE DEVICE)
Bilirubin, UA: NEGATIVE
Glucose, UA: NEGATIVE mg/dL
Ketones, POC UA: NEGATIVE mg/dL
Leukocytes, UA: NEGATIVE
Nitrite, UA: NEGATIVE
Protein Ur, POC: NEGATIVE mg/dL
Specific Gravity, Urine: 1.02
Urobilinogen, Ur: 0.2
pH, UA: 6 (ref 5.0–8.0)

## 2020-04-06 MED ORDER — VALSARTAN-HYDROCHLOROTHIAZIDE 320-12.5 MG PO TABS: 1.0000 | ORAL_TABLET | Freq: Every day | ORAL | 2 refills | Status: DC

## 2020-04-06 MED ORDER — AMLODIPINE BESYLATE 5 MG PO TABS: 5.0000 mg | ORAL_TABLET | Freq: Every day | ORAL | 2 refills | Status: DC

## 2020-04-06 NOTE — Progress Notes (Signed)
Subjective:  Judith Barr is a 60 y.o. female who presents for Chief Complaint  Patient presents with  . Hypertension     Here for blood pressure concerns.  Last visit here in 2018 as a new patient.  She never came back for follow-up.  She was initially diagnosed with high blood pressure in May 2018.  At her last visit here we had continued amlodipine 5 mg and losartan HCT 100/12.5 mg  Checking BPs at home and at work.  Recent numbers 212, 221, SBP.  DBP been in the 120s.  Sometimes SOB.   No symptoms in the last few days.   No chest pains.  No leg swelling.  No numbness, no tingling, no blurred vision.  Feels fine in general.  Hasn't been taking BP medication in past year.  Was lost to follow up during last 2 years in covid pandemic.  Works in a nursing home.    No other aggravating or relieving factors.    No other c/o.  Past Medical History:  Diagnosis Date  . Edema   . Hypertension 2018  . Obesity     The following portions of the patient's history were reviewed and updated as appropriate: allergies, current medications, past family history, past medical history, past social history, past surgical history and problem list.  ROS Otherwise as in subjective above  Objective: BP (!) 184/126   Pulse 78   Ht 5\' 2"  (1.575 m)   Wt 215 lb 9.6 oz (97.8 kg)   SpO2 98%   BMI 39.43 kg/m   General appearance: alert, no distress, well developed, well nourished Neck: supple, no lymphadenopathy, no thyromegaly, no masses, no bruits Heart: RRR, normal S1, S2, no murmurs Lungs: CTA bilaterally, no wheezes, rhonchi, or rales Abdomen: +bs, soft, non tender, non distended, no masses, no hepatomegaly, no splenomegaly Pulses: 2+ radial pulses, 2+ pedal pulses, normal cap refill Ext: no edema Neuro: CN II through XII intact, normal DTRs, normal strength and sensation, nonfocal exam  EKG  indication hypertensive emergency, rate 62 bpm, PR 150 ms, QRS 90 ms, QTC 442 ms, axis 61 degrees,  normal sinus rhythm    Assessment: Encounter Diagnoses  Name Primary?  . Hypertensive urgency Yes  . Noncompliance      Plan: Asymptomatic, but hypertensive urgency.  We discussed risk of uncontrolled high blood pressure.  Fortunately she has had no recent symptoms.  Labs today, urine today, EKG reviewed  Begin back on ARB/diuretic combo valsartan HCT.  After 3 days then begin on amlodipine as well.  She reports that when she was on a similar regimen back in 2018 she never saw numbers down into goal range but never followed up.  She will likely need to be on a fourth agent as well  We will also make referral to cardiology  Jannely was seen today for hypertension.  Diagnoses and all orders for this visit:  Hypertensive urgency -     EKG 12-Lead -     POCT Urinalysis DIP (Proadvantage Device) -     Basic metabolic panel -     CBC -     Ambulatory referral to Cardiology  Noncompliance -     EKG 12-Lead -     POCT Urinalysis DIP (Proadvantage Device) -     Basic metabolic panel -     CBC -     Ambulatory referral to Cardiology  Other orders -     valsartan-hydrochlorothiazide (DIOVAN-HCT) 320-12.5 MG tablet; Take 1 tablet  by mouth daily. -     amLODipine (NORVASC) 5 MG tablet; Take 1 tablet (5 mg total) by mouth daily.    Follow up: pending labs

## 2020-04-06 NOTE — Patient Instructions (Addendum)
Recommendations:  Begin Valsartan HCT 320/12.5mg , 1 tablet daily  On Thursday begin Amlodipine 5mg  tablet daily as well  Limit salt  Limit fast food, junk food, soda  We are going to also refer you to cardiology for consult   Lets recheck in 1 week to make sure pressure is down out of the danger zone      Hypertension, Adult Hypertension is another name for high blood pressure. High blood pressure forces your heart to work harder to pump blood. This can cause problems over time. There are two numbers in a blood pressure reading. There is a top number (systolic) over a bottom number (diastolic). It is best to have a blood pressure that is below 120/80. Healthy choices can help lower your blood pressure, or you may need medicine to help lower it. What are the causes? The cause of this condition is not known. Some conditions may be related to high blood pressure. What increases the risk?  Smoking.  Having type 2 diabetes mellitus, high cholesterol, or both.  Not getting enough exercise or physical activity.  Being overweight.  Having too much fat, sugar, calories, or salt (sodium) in your diet.  Drinking too much alcohol.  Having long-term (chronic) kidney disease.  Having a family history of high blood pressure.  Age. Risk increases with age.  Race. You may be at higher risk if you are African American.  Gender. Men are at higher risk than women before age 40. After age 86, women are at higher risk than men.  Having obstructive sleep apnea.  Stress. What are the signs or symptoms?  High blood pressure may not cause symptoms. Very high blood pressure (hypertensive crisis) may cause: ? Headache. ? Feelings of worry or nervousness (anxiety). ? Shortness of breath. ? Nosebleed. ? A feeling of being sick to your stomach (nausea). ? Throwing up (vomiting). ? Changes in how you see. ? Very bad chest pain. ? Seizures. How is this treated?  This condition is  treated by making healthy lifestyle changes, such as: ? Eating healthy foods. ? Exercising more. ? Drinking less alcohol.  Your health care provider may prescribe medicine if lifestyle changes are not enough to get your blood pressure under control, and if: ? Your top number is above 130. ? Your bottom number is above 80.  Your personal target blood pressure may vary. Follow these instructions at home: Eating and drinking  If told, follow the DASH eating plan. To follow this plan: ? Fill one half of your plate at each meal with fruits and vegetables. ? Fill one fourth of your plate at each meal with whole grains. Whole grains include whole-wheat pasta, Robbins rice, and whole-grain bread. ? Eat or drink low-fat dairy products, such as skim milk or low-fat yogurt. ? Fill one fourth of your plate at each meal with low-fat (lean) proteins. Low-fat proteins include fish, chicken without skin, eggs, beans, and tofu. ? Avoid fatty meat, cured and processed meat, or chicken with skin. ? Avoid pre-made or processed food.  Eat less than 1,500 mg of salt each day.  Do not drink alcohol if: ? Your doctor tells you not to drink. ? You are pregnant, may be pregnant, or are planning to become pregnant.  If you drink alcohol: ? Limit how much you use to:  0-1 drink a day for women.  0-2 drinks a day for men. ? Be aware of how much alcohol is in your drink. In the [place] one drink  equals one 12 oz bottle of beer (355 mL), one 5 oz glass of wine (148 mL), or one 1 oz glass of hard liquor (44 mL).   Lifestyle  Work with your doctor to stay at a healthy weight or to lose weight. Ask your doctor what the best weight is for you.  Get at least 30 minutes of exercise most days of the week. This may include walking, swimming, or biking.  Get at least 30 minutes of exercise that strengthens your muscles (resistance exercise) at least 3 days a week. This may include lifting weights or doing  Pilates.  Do not use any products that contain nicotine or tobacco, such as cigarettes, e-cigarettes, and chewing tobacco. If you need help quitting, ask your doctor.  Check your blood pressure at home as told by your doctor.  Keep all follow-up visits as told by your doctor. This is important.   Medicines  Take over-the-counter and prescription medicines only as told by your doctor. Follow directions carefully.  Do not skip doses of blood pressure medicine. The medicine does not work as well if you skip doses. Skipping doses also puts you at risk for problems.  Ask your doctor about side effects or reactions to medicines that you should watch for. Contact a doctor if you:  Think you are having a reaction to the medicine you are taking.  Have headaches that keep coming back (recurring).  Feel dizzy.  Have swelling in your ankles.  Have trouble with your vision. Get help right away if you:  Get a very bad headache.  Start to feel mixed up (confused).  Feel weak or numb.  Feel faint.  Have very bad pain in your: ? Chest. ? Belly (abdomen).  Throw up more than once.  Have trouble breathing. Summary  Hypertension is another name for high blood pressure.  High blood pressure forces your heart to work harder to pump blood.  For most people, a normal blood pressure is less than 120/80.  Making healthy choices can help lower blood pressure. If your blood pressure does not get lower with healthy choices, you may need to take medicine. This information is not intended to replace advice given to you by your health care provider. Make sure you discuss any questions you have with your health care provider. Document Revised: 09/13/2017 Document Reviewed: 09/13/2017 Elsevier Patient Education  2021 [employer].

## 2020-04-10 ENCOUNTER — Encounter: Payer: Self-pay | Admitting: Medical

## 2020-04-10 ENCOUNTER — Other Ambulatory Visit: Payer: Self-pay

## 2020-04-10 ENCOUNTER — Ambulatory Visit: Payer: BC Managed Care – PPO | Admitting: Medical

## 2020-04-10 VITALS — BP 120/88 | HR 58 | Ht 62.0 in | Wt 212.8 lb

## 2020-04-10 DIAGNOSIS — Z7185 Encounter for immunization safety counseling: Secondary | ICD-10-CM

## 2020-04-10 DIAGNOSIS — Z1211 Encounter for screening for malignant neoplasm of colon: Secondary | ICD-10-CM

## 2020-04-10 DIAGNOSIS — I10 Essential (primary) hypertension: Secondary | ICD-10-CM | POA: Diagnosis not present

## 2020-04-10 DIAGNOSIS — Z1231 Encounter for screening mammogram for malignant neoplasm of breast: Secondary | ICD-10-CM

## 2020-04-10 DIAGNOSIS — I1A0 Resistant hypertension: Secondary | ICD-10-CM | POA: Insufficient documentation

## 2020-04-10 NOTE — Progress Notes (Signed)
Subjective:  Judith Barr is a 60 y.o. female who presents for Chief Complaint  Patient presents with  . Follow-up    Pt present for a 1 week follow up on blood pressure      Here for 1 week follow up on hypertensive urgency.   Last week we began Valsartan HCT 320/12.5mg  daily, and 3 days later added Amlodipine 5mg  daily.   Had some stomach cramps at first, but now that resolved.    Does a lot of walking for exercise.  Has cardiology consult 04/20/20 coming up per our referral last visit.    Last visit a week ago she came in for really high BPs.  Last visit before last week was 2018 as a new patient.  She never came back for follow-up.  She was initially diagnosed with high blood pressure in May 2018.  She was started on amlodipine 5 mg and losartan HCT 100/12.5 mg in 2018 but eventual stopped this.  No symptoms in the last few days.   No chest pains.  No leg swelling.  No numbness, no tingling, no blurred vision.  Feels fine in general.  Hasn't been taking BP medication in past year.  Was lost to follow up during last 2 years in covid pandemic.  Works in a nursing home.    No other aggravating or relieving factors.    No other c/o.  Past Medical History:  Diagnosis Date  . Edema   . Hypertension 2018  . Obesity     The following portions of the patient's history were reviewed and updated as appropriate: allergies, current medications, past family history, past medical history, past social history, past surgical history and problem list.  ROS Otherwise as in subjective above    Objective: BP 120/88   Pulse (!) 58   Ht 5\' 2"  (1.575 m)   Wt 212 lb 12.8 oz (96.5 kg)   SpO2 97%   BMI 38.92 kg/m   Wt Readings from Last 3 Encounters:  04/10/20 212 lb 12.8 oz (96.5 kg)  04/06/20 215 lb 9.6 oz (97.8 kg)  04/28/18 200 lb (90.7 kg)    General appearance: alert, no distress, well developed, well nourished Neck: supple, no lymphadenopathy, no thyromegaly, no masses, no  bruits Heart: RRR, normal S1, S2, no murmurs Lungs: CTA bilaterally, no wheezes, rhonchi, or rales Pulses: 2+ radial pulses, 2+ pedal pulses, normal cap refill Ext: no edema    Assessment: Encounter Diagnoses  Name Primary?  . Essential hypertension, benign Yes  . Encounter for screening mammogram for malignant neoplasm of breast   . Vaccine counseling   . Screen for colon cancer      Plan: HTN - suprisingly BP much improved from last week.  Continue the new medications, follow up with cardiology for new patient consult soon as scheduled.   Patient Instructions  Now that you are back in to the office with, I recommend you consider the following recommendations  At 60 years old you are due for certain screenings  I recommend you call your insurance to check coverage for these recommendations below   Cancer screenings:  I recommend a colon cancer screening such as Cologuard or colonoscopy if you have not had this done within the past 10 years  I recommend an updated mammogram  I recommend an updated Pap smear the last one was more than 3 years ago  Please call to schedule your mammogram.   [facility]  [phone]  [address] [place], [place] [address]     Vaccines: I recommend a yearly flu shot in the fall  I recommend the shingles vaccine, 2 shots 2 months apart  I recommend the pneumococcal 23 vaccine to prevent certain strains of pneumonia  I recommend the COVID vaccine if you have not had this done  Recommend a tetanus shot if your last one was over 10 years ago    I recommend you return at your convenience for a fasting physical in a few months and to update these recommendations above   I recommend you see your eye doctor yearly as well as dentist every 6 months      Judith Barr was seen today for follow-up.  Diagnoses and all orders for this visit:  Essential hypertension, benign  Encounter for  screening mammogram for malignant neoplasm of breast -     MM DIGITAL SCREENING BILATERAL; Future  Vaccine counseling  Screen for colon cancer    Follow up:  Soon for fasting physical

## 2020-04-10 NOTE — Patient Instructions (Addendum)
Now that you are back in to the office with, I recommend you consider the following recommendations  At 60 years old you are due for certain screenings  I recommend you call your insurance to check coverage for these recommendations below   Cancer screenings:  I recommend a colon cancer screening such as Cologuard or colonoscopy if you have not had this done within the past 10 years  I recommend an updated mammogram  I recommend an updated Pap smear the last one was more than 3 years ago  Please call to schedule your mammogram.   [facility]  [phone] [address], [address] [place], [place] [address]     Vaccines: I recommend a yearly flu shot in the fall  I recommend the shingles vaccine, 2 shots 2 months apart  I recommend the pneumococcal 23 vaccine to prevent certain strains of pneumonia  I recommend the COVID vaccine if you have not had this done  Recommend a tetanus shot if your last one was over 10 years ago    I recommend you return at your convenience for a fasting physical in a few months and to update these recommendations above   I recommend you see your eye doctor yearly as well as dentist every 6 months

## 2020-04-13 ENCOUNTER — Ambulatory Visit: Payer: BC Managed Care – PPO | Admitting: Medical

## 2020-04-17 NOTE — Progress Notes (Signed)
Patient referred by Carlena Hurl, PA-C for hypertension  Subjective:   Judith Barr, female    DOB: Jan 20, 1960, 60 y.o.   MRN: [id number]   Chief Complaint  Patient presents with  . Hypertensive Urgency  . New Patient (Initial Visit)     HPI  60 y.o. 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  He walks at work and outside of work.  She has known hypertension for 2 years, and has been on amlodipine with recent addition of valsartan hydrochlorothiazide.  Blood pressure remains elevated.  She endorses compliance.  She is not limiting salt in the diet.  She does not take any NSAIDs.  She drinks soda perhaps once a week.  She does not drink any alcohol.  She does not have a family history of hypertension.  She denies any chest pain.  She does have exertional dyspnea on walking.  She denies any orthopnea, PND, leg edema symptoms.   Past Medical History:  Diagnosis Date  . Edema   . Hypertension 2018  . Obesity      Past Surgical History:  Procedure Laterality Date  . CHOLECYSTECTOMY    . TUBAL LIGATION       Social History   Tobacco Use  Smoking Status Never Smoker  Smokeless Tobacco Never Used    Social History   Substance and Sexual Activity  Alcohol Use No  . Alcohol/week: 0.0 standard drinks     Family History  Problem Relation Age of Onset  . Diabetes Mother   . Diabetes Sister   . Diabetes Maternal Grandmother   . Other Brother        unknown death, homeless  . Cancer Daughter        blood cancer  . Cancer Maternal Uncle   . Cancer Daughter        sickle cell  . Other Sister        gun shot     Current Outpatient Medications on File Prior to Visit  Medication Sig Dispense Refill  . amLODipine (NORVASC) 5 MG tablet Take 1 tablet (5 mg total) by mouth daily. 30 tablet 2  . valsartan-hydrochlorothiazide (DIOVAN-HCT) 320-12.5 MG tablet Take 1 tablet by mouth daily. 30 tablet 2    No current facility-administered medications on file prior to visit.    Cardiovascular and other pertinent studies:  EKG 04/06/2020: Sinus rhythm 62 bpm Nonspecific kidney abnormality   Recent labs: 04/06/2020: Glucose 94, BUN/Cr 12/0.82. EGFR 83. Na/K 141/3.7. Rest of the CMP normal H/H 12.2/36.4. MCV 78. Platelets 309     Review of Systems  HENT:       Headache   Cardiovascular: Positive for dyspnea on exertion. Negative for chest pain, leg swelling, palpitations and syncope.         Vitals:   04/20/20 0814 04/20/20 0817  BP: (!) 172/94 (!) 180/99  Pulse: 63 70  Resp: 16   Temp: 98 F (36.7 C)   SpO2: 97%      Body mass index is 39.69 kg/m. Filed Weights   04/20/20 0814  Weight: 217 lb (98.4 kg)     Objective:   Physical Exam Vitals and nursing note reviewed.  Constitutional:      General: She is not in acute distress.    Appearance: She is obese.  Neck:     Vascular: No JVD.  Cardiovascular:     Rate and Rhythm: Normal rate and regular  rhythm.     Heart sounds: Normal heart sounds. No murmur heard.   Pulmonary:     Effort: Pulmonary effort is normal.     Breath sounds: Normal breath sounds. No wheezing or rales.            Assessment & Recommendations:   60 year old African-American female with resistant hypertension  Resistant hypertension: Compliant with diet and medications.  No known reversible cause. Check renal/aldosterone, renal artery duplex.  Also check echocardiogram. Increase amlodipine to 10 mg daily.  Added labetalol 200 mg twice daily.  Encourage regular physical activity, walking 2000 steps a day, low-salt DASH diet.  Further recommendations after above testing.  Thank you for referring the patient to [place]. Please feel free to contact with any questions.   Nigel Mormon, MD Pager: [phone] Office: [phone]

## 2020-04-20 ENCOUNTER — Other Ambulatory Visit: Payer: Self-pay

## 2020-04-20 ENCOUNTER — Ambulatory Visit: Payer: BC Managed Care – PPO | Admitting: Medical

## 2020-04-20 ENCOUNTER — Ambulatory Visit: Payer: BLUE CROSS/BLUE SHIELD | Admitting: Cardiology

## 2020-04-20 ENCOUNTER — Encounter: Payer: Self-pay | Admitting: Cardiology

## 2020-04-20 DIAGNOSIS — I10 Essential (primary) hypertension: Secondary | ICD-10-CM

## 2020-04-20 MED ORDER — LABETALOL HCL 200 MG PO TABS: 200.0000 mg | ORAL_TABLET | Freq: Two times a day (BID) | ORAL | 1 refills | Status: DC

## 2020-04-20 MED ORDER — AMLODIPINE BESYLATE 10 MG PO TABS: 10.0000 mg | ORAL_TABLET | Freq: Every day | ORAL | 3 refills | Status: DC

## 2020-04-20 NOTE — Patient Instructions (Addendum)
https://www.nhlbi.nih.gov/files/docs/public/heart/dash_brief.pdf">  DASH Eating Plan DASH stands for Dietary Approaches to Stop Hypertension. The DASH eating plan is a healthy eating plan that has been shown to:  Reduce high blood pressure (hypertension).  Reduce your risk for type 2 diabetes, heart disease, and stroke.  Help with weight loss. What are tips for following this plan? Reading food labels  Check food labels for the amount of salt (sodium) per serving. Choose foods with less than 5 percent of the Daily Value of sodium. Generally, foods with less than 300 milligrams (mg) of sodium per serving fit into this eating plan.  To find whole grains, look for the word "whole" as the first word in the ingredient list. Shopping  Buy products labeled as "low-sodium" or "no salt added."  Buy fresh foods. Avoid canned foods and pre-made or frozen meals. Cooking  Avoid adding salt when cooking. Use salt-free seasonings or herbs instead of table salt or sea salt. Check with your health care provider or pharmacist before using salt substitutes.  Do not fry foods. Cook foods using healthy methods such as baking, boiling, grilling, roasting, and broiling instead.  Cook with heart-healthy oils, such as olive, canola, avocado, soybean, or sunflower oil. Meal planning  Eat a balanced diet that includes: ? 4 or more servings of fruits and 4 or more servings of vegetables each day. Try to fill one-half of your plate with fruits and vegetables. ? 6-8 servings of whole grains each day. ? Less than 6 oz (170 g) of lean meat, poultry, or fish each day. A 3-oz (85-g) serving of meat is about the same size as a deck of cards. One egg equals 1 oz (28 g). ? 2-3 servings of low-fat dairy each day. One serving is 1 cup (237 mL). ? 1 serving of nuts, seeds, or beans 5 times each week. ? 2-3 servings of heart-healthy fats. Healthy fats called omega-3 fatty acids are found in foods such as walnuts,  flaxseeds, fortified milks, and eggs. These fats are also found in cold-water fish, such as sardines, salmon, and mackerel.  Limit how much you eat of: ? Canned or prepackaged foods. ? Food that is high in trans fat, such as some fried foods. ? Food that is high in saturated fat, such as fatty meat. ? Desserts and other sweets, sugary drinks, and other foods with added sugar. ? Full-fat dairy products.  Do not salt foods before eating.  Do not eat more than 4 egg yolks a week.  Try to eat at least 2 vegetarian meals a week.  Eat more home-cooked food and less restaurant, buffet, and fast food.   Lifestyle  When eating at a restaurant, ask that your food be prepared with less salt or no salt, if possible.  If you drink alcohol: ? Limit how much you use to:  0-1 drink a day for women who are not pregnant.  0-2 drinks a day for men. ? Be aware of how much alcohol is in your drink. In the [place] one drink equals one 12 oz bottle of beer (355 mL), one 5 oz glass of wine (148 mL), or one 1 oz glass of hard liquor (44 mL). General information  Avoid eating more than 2,300 mg of salt a day. If you have hypertension, you may need to reduce your sodium intake to 1,500 mg a day.  Work with your health care provider to maintain a healthy body weight or to lose weight. Ask what an ideal weight is for   you.  Get at least 30 minutes of exercise that causes your heart to beat faster (aerobic exercise) most days of the week. Activities may include walking, swimming, or biking.  Work with your health care provider or dietitian to adjust your eating plan to your individual calorie needs. What foods should I eat? Fruits All fresh, dried, or frozen fruit. Canned fruit in natural juice (without added sugar). Vegetables Fresh or frozen vegetables (raw, steamed, roasted, or grilled). Low-sodium or reduced-sodium tomato and vegetable juice. Low-sodium or reduced-sodium tomato sauce and tomato paste.  Low-sodium or reduced-sodium canned vegetables. Grains Whole-grain or whole-wheat bread. Whole-grain or whole-wheat pasta. Huss rice. Oatmeal. Quinoa. Bulgur. Whole-grain and low-sodium cereals. Pita bread. Low-fat, low-sodium crackers. Whole-wheat flour tortillas. Meats and other proteins Skinless chicken or turkey. Ground chicken or turkey. Pork with fat trimmed off. Fish and seafood. Egg whites. Dried beans, peas, or lentils. Unsalted nuts, nut butters, and seeds. Unsalted canned beans. Lean cuts of beef with fat trimmed off. Low-sodium, lean precooked or cured meat, such as sausages or meat loaves. Dairy Low-fat (1%) or fat-free (skim) milk. Reduced-fat, low-fat, or fat-free cheeses. Nonfat, low-sodium ricotta or cottage cheese. Low-fat or nonfat yogurt. Low-fat, low-sodium cheese. Fats and oils Soft margarine without trans fats. Vegetable oil. Reduced-fat, low-fat, or light mayonnaise and salad dressings (reduced-sodium). Canola, safflower, olive, avocado, soybean, and sunflower oils. Avocado. Seasonings and condiments Herbs. Spices. Seasoning mixes without salt. Other foods Unsalted popcorn and pretzels. Fat-free sweets. The items listed above may not be a complete list of foods and beverages you can eat. Contact a dietitian for more information. What foods should I avoid? Fruits Canned fruit in a light or heavy syrup. Fried fruit. Fruit in cream or butter sauce. Vegetables Creamed or fried vegetables. Vegetables in a cheese sauce. Regular canned vegetables (not low-sodium or reduced-sodium). Regular canned tomato sauce and paste (not low-sodium or reduced-sodium). Regular tomato and vegetable juice (not low-sodium or reduced-sodium). Pickles. Olives. Grains Baked goods made with fat, such as croissants, muffins, or some breads. Dry pasta or rice meal packs. Meats and other proteins Fatty cuts of meat. Ribs. Fried meat. Bacon. Bologna, salami, and other precooked or cured meats, such as  sausages or meat loaves. Fat from the back of a pig (fatback). Bratwurst. Salted nuts and seeds. Canned beans with added salt. Canned or smoked fish. Whole eggs or egg yolks. Chicken or turkey with skin. Dairy Whole or 2% milk, cream, and half-and-half. Whole or full-fat cream cheese. Whole-fat or sweetened yogurt. Full-fat cheese. Nondairy creamers. Whipped toppings. Processed cheese and cheese spreads. Fats and oils Butter. Stick margarine. Lard. Shortening. Ghee. Bacon fat. Tropical oils, such as coconut, palm kernel, or palm oil. Seasonings and condiments Onion salt, garlic salt, seasoned salt, table salt, and sea salt. Worcestershire sauce. Tartar sauce. Barbecue sauce. Teriyaki sauce. Soy sauce, including reduced-sodium. Steak sauce. Canned and packaged gravies. Fish sauce. Oyster sauce. Cocktail sauce. Store-bought horseradish. Ketchup. Mustard. Meat flavorings and tenderizers. Bouillon cubes. Hot sauces. Pre-made or packaged marinades. Pre-made or packaged taco seasonings. Relishes. Regular salad dressings. Other foods Salted popcorn and pretzels. The items listed above may not be a complete list of foods and beverages you should avoid. Contact a dietitian for more information. Where to find more information  National Heart, Lung, and Blood Institute: www.nhlbi.nih.gov  American Heart Association: www.heart.org  Academy of Nutrition and Dietetics: www.eatright.org  National Kidney Foundation: www.kidney.org Summary  The DASH eating plan is a healthy eating plan that has been shown to   reduce high blood pressure (hypertension). It may also reduce your risk for type 2 diabetes, heart disease, and stroke.  When on the DASH eating plan, aim to eat more fresh fruits and vegetables, whole grains, lean proteins, low-fat dairy, and heart-healthy fats.  With the DASH eating plan, you should limit salt (sodium) intake to 2,300 mg a day. If you have hypertension, you may need to reduce your  sodium intake to 1,500 mg a day.  Work with your health care provider or dietitian to adjust your eating plan to your individual calorie needs. This information is not intended to replace advice given to you by your health care provider. Make sure you discuss any questions you have with your health care provider. Document Revised: 12/07/2018 Document Reviewed: 12/07/2018 Elsevier Patient Education  2021 Elsevier Inc.   Physical activity recommendation (The Physical Activity Guidelines for Americans. JAMA 2018;Nov 12) At least 150-300 minutes a week of moderate-intensity, or 75-150 minutes a week of vigorous-intensity aerobic physical activity, or an equivalent combination of moderate- and vigorous-intensity aerobic activity. Adults should perform muscle-strengthening activities on 2 or more days a week. Older adults should do multicomponent physical activity that includes balance training as well as aerobic and muscle-strengthening activities. Benefits of increased physical activity include lower risk of mortality including cardiovascular mortality, lower risk of cardiovascular events and associated risk factors (hypertension and diabetes), and lower risk of many cancers (including bladder, breast, colon, endometrium, esophagus, kidney, lung, and stomach). Additional improvments have been seen in cognition, risk of dementia, anxiety and depression, improved bone health, lower risk of falls, and associated injuries.

## 2020-05-18 ENCOUNTER — Other Ambulatory Visit: Payer: BC Managed Care – PPO

## 2020-05-22 ENCOUNTER — Other Ambulatory Visit: Payer: Self-pay

## 2020-05-22 ENCOUNTER — Ambulatory Visit: Payer: BC Managed Care – PPO

## 2020-05-22 DIAGNOSIS — I10 Essential (primary) hypertension: Secondary | ICD-10-CM | POA: Diagnosis not present

## 2020-05-26 DIAGNOSIS — I10 Essential (primary) hypertension: Secondary | ICD-10-CM | POA: Diagnosis not present

## 2020-06-01 ENCOUNTER — Other Ambulatory Visit: Payer: Self-pay

## 2020-06-01 ENCOUNTER — Encounter: Payer: Self-pay | Admitting: Cardiology

## 2020-06-01 ENCOUNTER — Ambulatory Visit: Payer: BC Managed Care – PPO | Admitting: Cardiology

## 2020-06-01 VITALS — BP 173/95 | HR 77 | Temp 98.0°F | Resp 17 | Ht 62.0 in | Wt 216.0 lb

## 2020-06-01 DIAGNOSIS — I10 Essential (primary) hypertension: Secondary | ICD-10-CM | POA: Diagnosis not present

## 2020-06-01 MED ORDER — BIDIL 20-37.5 MG PO TABS
1.0000 | ORAL_TABLET | Freq: Three times a day (TID) | ORAL | 3 refills | Status: DC
Start: 2020-06-01 — End: 2020-06-29

## 2020-06-01 MED ORDER — LABETALOL HCL 300 MG PO TABS: 300.0000 mg | ORAL_TABLET | Freq: Two times a day (BID) | ORAL | 2 refills | Status: DC

## 2020-06-01 NOTE — Progress Notes (Signed)
Patient referred by Carlena Hurl, PA-C for hypertension  Subjective:   Judith Barr, female    DOB: [birth year], 60 y.o.   MRN: [id number]   Chief Complaint  Patient presents with  . Hypertension  . Results    Labs and echo  . Follow-up    4 weeks     HPI  60 y.o. African-American female with uncontrolled hypertension  Reviewed echocardiogram and renal artery duplex results with the patient, details below.   Patient is compliant with medical therapy. Blood pressure stays elevated. Patient walks 3 miles a week, but does report dyspnea on exertion.   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  She walks at work and outside of work.  She has known hypertension for 2 years, and has been on amlodipine with recent addition of valsartan hydrochlorothiazide.  Blood pressure remains elevated.  She endorses compliance.  She is not limiting salt in the diet.  She does not take any NSAIDs.  She drinks soda perhaps once a week.  She does not drink any alcohol.  She does not have a family history of hypertension.  She denies any chest pain.  She does have exertional dyspnea on walking.  She denies any orthopnea, PND, leg edema symptoms.   Current Outpatient Medications on File Prior to Visit  Medication Sig Dispense Refill  . amLODipine (NORVASC) 10 MG tablet Take 1 tablet (10 mg total) by mouth daily. 60 tablet 3  . labetalol (NORMODYNE) 200 MG tablet Take 1 tablet (200 mg total) by mouth 2 (two) times daily. 60 tablet 1  . valsartan-hydrochlorothiazide (DIOVAN-HCT) 320-12.5 MG tablet Take 1 tablet by mouth daily. 30 tablet 2   No current facility-administered medications on file prior to visit.    Cardiovascular and other pertinent studies:  EKG 04/06/2020: Sinus rhythm 62 bpm Nonspecific kidney abnormality  Echocardiogram 05/22/2020:  Left ventricle cavity is normal in size. Moderate concentric hypertrophy  of the left ventricle. Normal  global wall motion. Normal LV systolic  function with EF 61%. Doppler evidence of grade II (pseudonormal)  diastolic dysfunction, elevated LAP.  Left atrial cavity is mildly dilated.  Trileaflet aortic valve. Trace aortic regurgitation.  Moderate (Grade II) mitral regurgitation.  Mild to moderate tricuspid regurgitation. Estimated pulmonary artery  systolic pressure 28 mmHg.  Renal artery duplex 05/22/2020:  No evidence of renal artery occlusive disease in either renal artery.  Normal intrarenal vascular perfusion is noted in both kidneys.  Renal length is within normal limits for both kidneys.  Normal abdominal aorta flow velocities noted.  Recent labs: 04/06/2020: Glucose 94, BUN/Cr 12/0.82. EGFR 83. Na/K 141/3.7. Rest of the CMP normal H/H 12.2/36.4. MCV 78. Platelets 309     Review of Systems  HENT:       Headache   Cardiovascular: Positive for dyspnea on exertion. Negative for chest pain, leg swelling, palpitations and syncope.  Neurological: Positive for paresthesias.         Vitals:   06/01/20 1402 06/01/20 1403  BP: (!) 171/99 (!) 173/95  Pulse: 78 77  Resp: 17   Temp: 98 F (36.7 C)   SpO2: 99%      Body mass index is 39.51 kg/m. Filed Weights   06/01/20 1402  Weight: 216 lb (98 kg)     Objective:   Physical Exam Vitals and nursing note reviewed.  Constitutional:      General: She is not in acute distress.  Appearance: She is obese.  Neck:     Vascular: No JVD.  Cardiovascular:     Rate and Rhythm: Normal rate and regular rhythm.     Heart sounds: Normal heart sounds. No murmur heard.   Pulmonary:     Effort: Pulmonary effort is normal.     Breath sounds: Normal breath sounds. No wheezing or rales.  Musculoskeletal:     Right lower leg: No edema.     Left lower leg: No edema.            Assessment & Recommendations:   60 year old African-American female with resistant hypertension  Resistant hypertension: Compliant with  diet and medications.  No known reversible cause. Blood pressure remains uncontrolled. Continue amlodipine 10 mg daily, valsartan-HCTZ 320-12.5 mg daily. Increase labetalol to 300 mg bid. Added Bidil 20-37.5 mg tid.  I encouraged her to talk to her PCP re: boilateral arm and finger tingling. Given no focality to her symptoms, I do not think they reflect hypertensive urgency/TIA.   Fu in 4 weeks   Nigel Mormon, MD Pager: [phone] Office: [phone]

## 2020-06-04 LAB — ALDOSTERONE + RENIN ACTIVITY W/ RATIO
ALDOS/RENIN RATIO: >7.8 (ref 0.0–30.0)
ALDOSTERONE: 1.3 ng/dL (ref 0.0–30.0)
Renin: <0.167 ng/mL/hr — ABNORMAL LOW (ref 0.167–5.380)

## 2020-06-29 ENCOUNTER — Other Ambulatory Visit: Payer: Self-pay

## 2020-06-29 ENCOUNTER — Ambulatory Visit: Payer: BC Managed Care – PPO | Admitting: Cardiology

## 2020-06-29 ENCOUNTER — Inpatient Hospital Stay: Payer: BC Managed Care – PPO

## 2020-06-29 ENCOUNTER — Encounter: Payer: Self-pay | Admitting: Cardiology

## 2020-06-29 VITALS — BP 148/88 | HR 74 | Temp 97.7°F | Resp 16 | Ht 62.0 in | Wt 208.0 lb

## 2020-06-29 DIAGNOSIS — R002 Palpitations: Secondary | ICD-10-CM | POA: Diagnosis not present

## 2020-06-29 DIAGNOSIS — I10 Essential (primary) hypertension: Secondary | ICD-10-CM | POA: Diagnosis not present

## 2020-06-29 MED ORDER — ISOSORB DINITRATE-HYDRALAZINE 20-37.5 MG PO TABS: 2.0000 | ORAL_TABLET | Freq: Three times a day (TID) | ORAL | 3 refills | Status: DC

## 2020-06-29 NOTE — Progress Notes (Signed)
Patient referred by Judith Hurl, PA-C for hypertension  Subjective:   Judith Barr, female    DOB: 07-Jun-1960, 60 y.o.   MRN: [id number]   Chief Complaint  Patient presents with   Hypertension   Follow-up    4 week     HPI  60 y.o. African-American female with uncontrolled hypertension  Reviewed recent tests with the patient, details below. Blood pressure is improving. She reports "flutter sensation" on a daily basiss, lasting for about 30 sec-2 min.   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  She walks at work and outside of work.  She has known hypertension for 2 years, and has been on amlodipine with recent addition of valsartan hydrochlorothiazide.  Blood pressure remains elevated.  She endorses compliance.  She is not limiting salt in the diet.  She does not take any NSAIDs.  She drinks soda perhaps once a week.  She does not drink any alcohol.  She does not have a family history of hypertension.  She denies any chest pain.  She does have exertional dyspnea on walking.  She denies any orthopnea, PND, leg edema symptoms.   Current Outpatient Medications on File Prior to Visit  Medication Sig Dispense Refill   amLODipine (NORVASC) 10 MG tablet Take 1 tablet (10 mg total) by mouth daily. 60 tablet 3   isosorbide-hydrALAZINE (BIDIL) 20-37.5 MG tablet Take 1 tablet by mouth 3 (three) times daily. 90 tablet 3   labetalol (NORMODYNE) 300 MG tablet Take 1 tablet (300 mg total) by mouth 2 (two) times daily. 60 tablet 2   valsartan-hydrochlorothiazide (DIOVAN-HCT) 320-12.5 MG tablet Take 1 tablet by mouth daily. 30 tablet 2   No current facility-administered medications on file prior to visit.    Cardiovascular and other pertinent studies:  EKG 04/06/2020: Sinus rhythm 62 bpm Nonspecific kidney abnormality  Echocardiogram 05/22/2020:  Left ventricle cavity is normal in size. Moderate concentric hypertrophy  of the left ventricle.  Normal global wall motion. Normal LV systolic  function with EF 61%. Doppler evidence of grade II (pseudonormal)  diastolic dysfunction, elevated LAP.  Left atrial cavity is mildly dilated.  Trileaflet aortic valve.  Trace aortic regurgitation.  Moderate (Grade II) mitral regurgitation.  Mild to moderate tricuspid regurgitation. Estimated pulmonary artery  systolic pressure 28 mmHg.  Renal artery duplex  05/22/2020:  No evidence of renal artery occlusive disease in either renal artery.  Normal intrarenal vascular perfusion is noted in both kidneys.  Renal length is within normal limits for both kidneys.  Normal abdominal aorta flow velocities noted.  Recent labs: 04/06/2020: Glucose 94, BUN/Cr 12/0.82. EGFR 83. Na/K 141/3.7. Rest of the CMP normal H/H 12.2/36.4. MCV 78. Platelets 309  Results for Judith Barr (MRN [id number]) as of 06/29/2020 09:53  Ref. Range 05/26/2020 16:29  ALDOSTERONE Latest Ref Range: 0.0 - 30.0 ng/dL 1.3  Renin Latest Ref Range: 0.167 - 5.380 ng/mL/hr <0.167 (L)  ALDOS/RENIN RATIO Latest Ref Range: 0.0 - 30.0  >7.8     Review of Systems  Neurological:  Positive for paresthesias.        Vitals:   06/29/20 1408  BP: (!) 148/88  Pulse: 74  Resp: 16  Temp: 97.7 F (36.5 C)  SpO2: 96%    Body mass index is 38.04 kg/m. Filed Weights   06/29/20 1408  Weight: 208 lb (94.3 kg)     Objective:   Physical Exam Vitals and nursing note reviewed.  Constitutional:  General: She is not in acute distress. Neck:     Vascular: No JVD.  Cardiovascular:     Rate and Rhythm: Normal rate and regular rhythm.     Heart sounds: Normal heart sounds. No murmur heard. Pulmonary:     Effort: Pulmonary effort is normal.     Breath sounds: Normal breath sounds. No wheezing or rales.  Musculoskeletal:     Right lower leg: No edema.     Left lower leg: No edema.           Assessment & Recommendations:   60 year old African-American female with resistant  hypertension   Resistant hypertension: Compliant with diet and medications.  No known reversible cause. Blood pressure remains uncontrolled. Currently on amlodipine 10 mg daily, valsartan-HCTZ 320-12.5 mg daily, labetalol to 300 mg bid, Bidil 20-37.5 mg tid. Increase Bidil to 2 tab tid given promising improvement.  Palpitations: Recommend 1 week cardiac telemetry.  Fu in 6 weeks   Nigel Mormon, MD Pager: [phone] Office: [phone]

## 2020-07-08 ENCOUNTER — Encounter: Payer: BC Managed Care – PPO | Admitting: Medical

## 2020-07-10 DIAGNOSIS — R002 Palpitations: Secondary | ICD-10-CM | POA: Diagnosis not present

## 2020-08-02 DIAGNOSIS — R002 Palpitations: Secondary | ICD-10-CM | POA: Diagnosis not present

## 2020-08-10 ENCOUNTER — Ambulatory Visit: Payer: BC Managed Care – PPO | Admitting: Cardiology

## 2020-08-10 ENCOUNTER — Encounter: Payer: Self-pay | Admitting: Cardiology

## 2020-08-10 ENCOUNTER — Other Ambulatory Visit: Payer: Self-pay

## 2020-08-10 VITALS — BP 137/76 | HR 59 | Temp 98.2°F | Resp 16 | Ht 62.0 in | Wt 216.0 lb

## 2020-08-10 DIAGNOSIS — I10 Essential (primary) hypertension: Secondary | ICD-10-CM | POA: Diagnosis not present

## 2020-08-10 DIAGNOSIS — I471 Supraventricular tachycardia: Secondary | ICD-10-CM

## 2020-08-10 NOTE — Progress Notes (Signed)
Bmp    Patient referred by Carlena Hurl, PA-C for hypertension  Subjective:   Judith Barr, female    DOB: [birth year], 60 y.o.   MRN: [id number]   Chief Complaint  Patient presents with   Resistant hypertension   PSVT (paroxysmal supraventricular tachycardia)   Palpitations   Follow-up    6 weeks     HPI  60 y.o. African-American female with uncontrolled hypertension   Reviewed recent tests with the patient, details below. Blood pressure is better controlled. Spikes have reduce.d She recently has had some beck pain, which could be contributing. Fluuter like sensation is occasional and short lasting.   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  She walks at work and outside of work.  She has known hypertension for 2 years, and has been on amlodipine with recent addition of valsartan hydrochlorothiazide.  Blood pressure remains elevated.  She endorses compliance.  She is not limiting salt in the diet.  She does not take any NSAIDs.  She drinks soda perhaps once a week.  She does not drink any alcohol.  She does not have a family history of hypertension.  She denies any chest pain.  She does have exertional dyspnea on walking.  She denies any orthopnea, PND, leg edema symptoms.   Current Outpatient Medications on File Prior to Visit  Medication Sig Dispense Refill   amLODipine (NORVASC) 10 MG tablet Take 1 tablet (10 mg total) by mouth daily. 60 tablet 3   isosorbide-hydrALAZINE (BIDIL) 20-37.5 MG tablet Take 2 tablets by mouth 3 (three) times daily. 90 tablet 3   labetalol (NORMODYNE) 300 MG tablet Take 1 tablet (300 mg total) by mouth 2 (two) times daily. 60 tablet 2   valsartan-hydrochlorothiazide (DIOVAN-HCT) 320-12.5 MG tablet Take 1 tablet by mouth daily. 30 tablet 2   No current facility-administered medications on file prior to visit.    Cardiovascular and other pertinent studies:  EKG 04/06/2020: Sinus rhythm 62  bpm Nonspecific kidney abnormality  Echocardiogram 05/22/2020:  Left ventricle cavity is normal in size. Moderate concentric hypertrophy  of the left ventricle. Normal global wall motion. Normal LV systolic  function with EF 61%. Doppler evidence of grade II (pseudonormal)  diastolic dysfunction, elevated LAP.  Left atrial cavity is mildly dilated.  Trileaflet aortic valve.  Trace aortic regurgitation.  Moderate (Grade II) mitral regurgitation.  Mild to moderate tricuspid regurgitation. Estimated pulmonary artery  systolic pressure 28 mmHg.  Renal artery duplex  05/22/2020:  No evidence of renal artery occlusive disease in either renal artery.  Normal intrarenal vascular perfusion is noted in both kidneys.  Renal length is within normal limits for both kidneys.  Normal abdominal aorta flow velocities noted.  Recent labs: Mobile cardiac telemetry 2 days 06/29/2020 - 07/02/2020: Dominant rhythm: Sinus. HR 43-115 bpm. Avg HR 71 bpm, in sinus rhythm. 18 episodes of SVT, fastest at 207 bpm for 7 beats, longest for 31.7 secs at 103 bpm. <1% isolated SVE, <1% couplet/triplets. 0 episodes of VT <1% isolated VE, couplet/triplets. Inverted QRS complexes possibly due to inverted placement of device. No atrial fibrillation/atrial flutter/VT/high grade AV block, sinus pause >3sec noted. 0 patient triggered events.   04/06/2020: Glucose 94, BUN/Cr 12/0.82. EGFR 83. Na/K 141/3.7. Rest of the CMP normal H/H 12.2/36.4. MCV 78. Platelets 309  Results for EH, SAUSEDA (MRN [id number]) as of 06/29/2020 09:53  Ref. Range 05/26/2020 16:29  ALDOSTERONE Latest Ref Range: 0.0 - 30.0 ng/dL 1.3  Renin  Latest Ref Range: 0.167 - 5.380 ng/mL/hr <0.167 (L)  ALDOS/RENIN RATIO Latest Ref Range: 0.0 - 30.0  >7.8     Review of Systems  Neurological:  Positive for paresthesias.        Vitals:   08/10/20 1424 08/10/20 1434  BP: (!) 168/84 137/76  Pulse: 62 (!) 59  Resp: 16   Temp: 98.2 F (36.8 C)   SpO2:  97%      Body mass index is 39.51 kg/m. Filed Weights   08/10/20 1424  Weight: 216 lb (98 kg)     Objective:   Physical Exam Vitals and nursing note reviewed.  Constitutional:      General: She is not in acute distress. Neck:     Vascular: No JVD.  Cardiovascular:     Rate and Rhythm: Normal rate and regular rhythm.     Heart sounds: Normal heart sounds. No murmur heard. Pulmonary:     Effort: Pulmonary effort is normal.     Breath sounds: Normal breath sounds. No wheezing or rales.  Musculoskeletal:     Right lower leg: No edema.     Left lower leg: No edema.           Assessment & Recommendations:   60 year old African-American female with resistant hypertension, PSVT  Resistant hypertension: Compliant with diet and medications.  No known reversible cause. Blood pressure remains uncontrolled. Currently on amlodipine 10 mg daily, valsartan-HCTZ 320-12.5 mg daily, labetalol to 300 mg bid, Bidil 2 tabs of 20-37.5 mg tid. Neck pain could be contributing to blood pressure. Follow up with PCP. Also, recommend sleep study given suspicion for OSA as a common cause of hypertension and PSVT.  PSVT: Continue medical therapy and sleep study.   Labs in 09/2020  F/u in 10/2020   Nigel Mormon, MD Pager: [phone] Office: [phone]

## 2020-09-29 ENCOUNTER — Telehealth: Payer: Self-pay | Admitting: Internal Medicine

## 2020-09-29 NOTE — Telephone Encounter (Signed)
Pt was advised she needs to schedule an CPE she will call back to schedule.    No colonoscopy No pap No mammogram for 2 years

## 2020-10-06 DIAGNOSIS — I10 Essential (primary) hypertension: Secondary | ICD-10-CM | POA: Diagnosis not present

## 2020-10-07 LAB — BASIC METABOLIC PANEL
BUN/Creatinine Ratio: 10 (ref 9–23)
BUN: 9 mg/dL (ref 6–24)
CO2: 24 mmol/L (ref 20–29)
Calcium: 9.2 mg/dL (ref 8.7–10.2)
Chloride: 105 mmol/L (ref 96–106)
Creatinine, Ser: 0.86 mg/dL (ref 0.57–1.00)
Glucose: 94 mg/dL (ref 65–99)
Potassium: 3.8 mmol/L (ref 3.5–5.2)
Sodium: 142 mmol/L (ref 134–144)
eGFR: 78 mL/min/{1.73_m2} (ref >59)

## 2020-10-26 ENCOUNTER — Ambulatory Visit: Payer: BC Managed Care – PPO | Admitting: Cardiology

## 2020-10-27 ENCOUNTER — Institutional Professional Consult (permissible substitution): Payer: BC Managed Care – PPO | Admitting: Neurology

## 2020-11-16 ENCOUNTER — Encounter: Payer: Self-pay | Admitting: Cardiology

## 2020-11-16 ENCOUNTER — Ambulatory Visit: Payer: BC Managed Care – PPO | Admitting: Cardiology

## 2020-11-16 ENCOUNTER — Other Ambulatory Visit: Payer: Self-pay

## 2020-11-16 VITALS — BP 162/108 | HR 67 | Temp 98.0°F | Resp 16 | Ht 62.0 in | Wt 220.0 lb

## 2020-11-16 DIAGNOSIS — I471 Supraventricular tachycardia: Secondary | ICD-10-CM

## 2020-11-16 DIAGNOSIS — I10 Essential (primary) hypertension: Secondary | ICD-10-CM

## 2020-11-16 MED ORDER — CLONIDINE HCL 0.2 MG PO TABS: 0.2000 mg | ORAL_TABLET | Freq: Two times a day (BID) | ORAL | 2 refills | Status: DC

## 2020-11-16 NOTE — Progress Notes (Signed)
Bmp    Patient referred by Carlena Hurl, PA-C for hypertension  Subjective:   Judith Barr, female    DOB: 11/10/1960, 60 y.o.   MRN: [id number]   Chief Complaint  Patient presents with   Hypertension   Follow-up     HPI  60 y.o. African-American female with uncontrolled hypertension  Patient continues to have elevated blood pressure, as high as systolic blood pressure 110 mmHg at home.  She endorses compliance on all her medical therapy.  She has an upcoming sleeps study in December for suspected obstructive sleep apnea.  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  She walks at work and outside of work.  She has known hypertension for 2 years, and has been on amlodipine with recent addition of valsartan hydrochlorothiazide.  Blood pressure remains elevated.  She endorses compliance.  She is not limiting salt in the diet.  She does not take any NSAIDs.  She drinks soda perhaps once a week.  She does not drink any alcohol.  She does not have a family history of hypertension.  She denies any chest pain.  She does have exertional dyspnea on walking.  She denies any orthopnea, PND, leg edema symptoms.   Current Outpatient Medications on File Prior to Visit  Medication Sig Dispense Refill   amLODipine (NORVASC) 10 MG tablet Take 1 tablet (10 mg total) by mouth daily. 60 tablet 3   isosorbide-hydrALAZINE (BIDIL) 20-37.5 MG tablet Take 2 tablets by mouth 3 (three) times daily. 90 tablet 3   labetalol (NORMODYNE) 300 MG tablet Take 1 tablet (300 mg total) by mouth 2 (two) times daily. 60 tablet 2   valsartan-hydrochlorothiazide (DIOVAN-HCT) 320-12.5 MG tablet Take 1 tablet by mouth daily. 30 tablet 2   No current facility-administered medications on file prior to visit.    Cardiovascular and other pertinent studies:  EKG 11/16/2020: Sinus rhythm 69 bpm Nonspecific T-abnormality  Mobile cardiac telemetry 2 days 06/29/2020 -  07/02/2020: Dominant rhythm: Sinus. HR 43-115 bpm. Avg HR 71 bpm, in sinus rhythm. 18 episodes of SVT, fastest at 207 bpm for 7 beats, longest for 31.7 secs at 103 bpm. <1% isolated SVE, <1% couplet/triplets. 0 episodes of VT <1% isolated VE, couplet/triplets. Inverted QRS complexes possibly due to inverted placement of device. No atrial fibrillation/atrial flutter/VT/high grade AV block, sinus pause >3sec noted. 0 patient triggered events.  EKG 04/06/2020: Sinus rhythm 62 bpm Nonspecific kidney abnormality  Echocardiogram 05/22/2020:  Left ventricle cavity is normal in size. Moderate concentric hypertrophy  of the left ventricle. Normal global wall motion. Normal LV systolic  function with EF 61%. Doppler evidence of grade II (pseudonormal)  diastolic dysfunction, elevated LAP.  Left atrial cavity is mildly dilated.  Trileaflet aortic valve.  Trace aortic regurgitation.  Moderate (Grade II) mitral regurgitation.  Mild to moderate tricuspid regurgitation. Estimated pulmonary artery  systolic pressure 28 mmHg.  Renal artery duplex  05/22/2020:  No evidence of renal artery occlusive disease in either renal artery.  Normal intrarenal vascular perfusion is noted in both kidneys.  Renal length is within normal limits for both kidneys.  Normal abdominal aorta flow velocities noted.  Recent labs: 10/06/2020: Glucose 94, BUN/Cr 9/0.86. EGFR 78. Na/K 142/3.8.    04/06/2020: Glucose 94, BUN/Cr 12/0.82. EGFR 83. Na/K 141/3.7. Rest of the CMP normal H/H 12.2/36.4. MCV 78. Platelets 309  Results for CRISTIANA, YOCHIM (MRN [id number]) as of 06/29/2020 09:53  Ref. Range 05/26/2020 16:29  ALDOSTERONE Latest Ref  Range: 0.0 - 30.0 ng/dL 1.3  Renin Latest Ref Range: 0.167 - 5.380 ng/mL/hr <0.167 (L)  ALDOS/RENIN RATIO Latest Ref Range: 0.0 - 30.0  >7.8     Review of Systems  Neurological:  Positive for paresthesias.        Vitals:   11/16/20 1300 11/16/20 1306  BP: (!) 185/105 (!) 162/108   Pulse: 68 67  Resp: 16   Temp: 98 F (36.7 C)   SpO2: 100%     Body mass index is 40.24 kg/m. Filed Weights   11/16/20 1300  Weight: 220 lb (99.8 kg)     Objective:   Physical Exam Vitals and nursing note reviewed.  Constitutional:      General: She is not in acute distress. Neck:     Vascular: No JVD.  Cardiovascular:     Rate and Rhythm: Normal rate and regular rhythm.     Heart sounds: Normal heart sounds. No murmur heard. Pulmonary:     Effort: Pulmonary effort is normal.     Breath sounds: Normal breath sounds. No wheezing or rales.  Musculoskeletal:     Right lower leg: No edema.     Left lower leg: No edema.           Assessment & Recommendations:   60 year old African-American female with resistant hypertension, PSVT  Resistant hypertension: Compliant with diet and medications.  No known reversible cause. Blood pressure remains uncontrolled. Currently on amlodipine 10 mg daily, valsartan-HCTZ 320-12.5 mg daily, labetalol to 300 mg bid, Bidil 2 tabs of 20-37.5 mg tid. Added clonidine 0.2 mg twice daily.  Caution regarding potential central side effects, as well as significant bradycardia while on other AV nodal blocking agents.   Also, recommend sleep study given suspicion for OSA as a common cause of hypertension and PSVT.  PSVT: Continue medical therapy and sleep study.   F/u in 4 weeks   Nigel Mormon, MD Pager: [phone] Office: [phone]

## 2020-12-14 ENCOUNTER — Ambulatory Visit: Payer: BC Managed Care – PPO | Admitting: Cardiology

## 2020-12-14 ENCOUNTER — Encounter: Payer: Self-pay | Admitting: Cardiology

## 2020-12-14 ENCOUNTER — Other Ambulatory Visit: Payer: Self-pay

## 2020-12-14 VITALS — BP 166/101 | HR 62 | Resp 16 | Ht 62.0 in | Wt 223.0 lb

## 2020-12-14 DIAGNOSIS — I10 Essential (primary) hypertension: Secondary | ICD-10-CM | POA: Diagnosis not present

## 2020-12-14 DIAGNOSIS — I471 Supraventricular tachycardia: Secondary | ICD-10-CM | POA: Diagnosis not present

## 2020-12-14 DIAGNOSIS — R002 Palpitations: Secondary | ICD-10-CM | POA: Diagnosis not present

## 2020-12-14 MED ORDER — CLONIDINE HCL 0.3 MG/24HR TD PTWK: 0.3000 mg | MEDICATED_PATCH | TRANSDERMAL | 12 refills | Status: DC

## 2020-12-14 NOTE — Progress Notes (Signed)
Bmp    Patient referred by Carlena Hurl, PA-C for hypertension  Subjective:   Judith Barr, female    DOB: Feb 18, 1960, 60 y.o.   MRN: [id number]   Chief Complaint  Patient presents with   Hypertension   Follow-up     HPI  60 y.o. African-American female with uncontrolled hypertension  Patient continues to have elevated blood pressure, as high as systolic blood pressure 701 mmHg at home.  She endorses compliance with all her medical therapy.  She has an upcoming sleeps study in December for suspected obstructive sleep apnea.  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  She walks at work and outside of work.  She has known hypertension for 2 years, and has been on amlodipine with recent addition of valsartan hydrochlorothiazide.  Blood pressure remains elevated.  She endorses compliance.  She is not limiting salt in the diet.  She does not take any NSAIDs.  She drinks soda perhaps once a week.  She does not drink any alcohol.  She does not have a family history of hypertension.  She denies any chest pain.  She does have exertional dyspnea on walking.  She denies any orthopnea, PND, leg edema symptoms.   Current Outpatient Medications on File Prior to Visit  Medication Sig Dispense Refill   amLODipine (NORVASC) 10 MG tablet Take 1 tablet (10 mg total) by mouth daily. 60 tablet 3   cloNIDine (CATAPRES) 0.2 MG tablet Take 1 tablet (0.2 mg total) by mouth 2 (two) times daily. 60 tablet 2   isosorbide-hydrALAZINE (BIDIL) 20-37.5 MG tablet Take 2 tablets by mouth 3 (three) times daily. 90 tablet 3   labetalol (NORMODYNE) 300 MG tablet Take 1 tablet (300 mg total) by mouth 2 (two) times daily. 60 tablet 2   valsartan-hydrochlorothiazide (DIOVAN-HCT) 320-12.5 MG tablet Take 1 tablet by mouth daily. 30 tablet 2   No current facility-administered medications on file prior to visit.    Cardiovascular and other pertinent studies:  EKG  11/16/2020: Sinus rhythm 69 bpm Nonspecific T-abnormality  Mobile cardiac telemetry 2 days 06/29/2020 - 07/02/2020: Dominant rhythm: Sinus. HR 43-115 bpm. Avg HR 71 bpm, in sinus rhythm. 18 episodes of SVT, fastest at 207 bpm for 7 beats, longest for 31.7 secs at 103 bpm. <1% isolated SVE, <1% couplet/triplets. 0 episodes of VT <1% isolated VE, couplet/triplets. Inverted QRS complexes possibly due to inverted placement of device. No atrial fibrillation/atrial flutter/VT/high grade AV block, sinus pause >3sec noted. 0 patient triggered events.  EKG 04/06/2020: Sinus rhythm 62 bpm Nonspecific kidney abnormality  Echocardiogram 05/22/2020:  Left ventricle cavity is normal in size. Moderate concentric hypertrophy  of the left ventricle. Normal global wall motion. Normal LV systolic  function with EF 61%. Doppler evidence of grade II (pseudonormal)  diastolic dysfunction, elevated LAP.  Left atrial cavity is mildly dilated.  Trileaflet aortic valve.  Trace aortic regurgitation.  Moderate (Grade II) mitral regurgitation.  Mild to moderate tricuspid regurgitation. Estimated pulmonary artery  systolic pressure 28 mmHg.  Renal artery duplex  05/22/2020:  No evidence of renal artery occlusive disease in either renal artery.  Normal intrarenal vascular perfusion is noted in both kidneys.  Renal length is within normal limits for both kidneys.  Normal abdominal aorta flow velocities noted.  Recent labs: 10/06/2020: Glucose 94, BUN/Cr 9/0.86. EGFR 78. Na/K 142/3.8.    04/06/2020: Glucose 94, BUN/Cr 12/0.82. EGFR 83. Na/K 141/3.7. Rest of the CMP normal H/H 12.2/36.4. MCV 78.  Platelets 309  Results for PERLA, ECHAVARRIA (MRN [id number]) as of 06/29/2020 09:53  Ref. Range 05/26/2020 16:29  ALDOSTERONE Latest Ref Range: 0.0 - 30.0 ng/dL 1.3  Renin Latest Ref Range: 0.167 - 5.380 ng/mL/hr <0.167 (L)  ALDOS/RENIN RATIO Latest Ref Range: 0.0 - 30.0  >7.8     Review of Systems  Neurological:   Positive for paresthesias.        Vitals:   12/14/20 1115  BP: (!) 171/98  Pulse: 62  Resp: 16  SpO2: 95%    Body mass index is 40.79 kg/m. Filed Weights   12/14/20 1115  Weight: 223 lb (101.2 kg)     Objective:   Physical Exam Vitals and nursing note reviewed.  Constitutional:      General: She is not in acute distress. Neck:     Vascular: No JVD.  Cardiovascular:     Rate and Rhythm: Normal rate and regular rhythm.     Heart sounds: Normal heart sounds. No murmur heard. Pulmonary:     Effort: Pulmonary effort is normal.     Breath sounds: Normal breath sounds. No wheezing or rales.  Musculoskeletal:     Right lower leg: No edema.     Left lower leg: No edema.           Assessment & Recommendations:   60 year old African-American female with resistant hypertension, PSVT  Resistant hypertension: Compliant with diet and medications.  No known reversible cause. Blood pressure remains uncontrolled. Currently on amlodipine 10 mg daily, valsartan-HCTZ 320-12.5 mg daily, labetalol to 300 mg bid, Bidil 2 tabs of 20-37.5 mg tid. Changed clonidine 0.2 mg twice daily to 0.3 ng/24 hr patch weekly. Also, recommend sleep study given suspicion for OSA as a common cause of hypertension and PSVT. Will look at RADIANCE renal denervation clinical trial in future, unfortunately not available at [facility] at this time.  PSVT: Continue medical therapy and sleep study.   F/u in 4 weeks   Nigel Mormon, MD Pager: [phone] Office: [phone]

## 2020-12-18 ENCOUNTER — Other Ambulatory Visit: Payer: Self-pay

## 2020-12-18 DIAGNOSIS — I1A0 Resistant hypertension: Secondary | ICD-10-CM

## 2020-12-18 DIAGNOSIS — I10 Essential (primary) hypertension: Secondary | ICD-10-CM

## 2020-12-18 MED ORDER — CLONIDINE HCL 0.3 MG/24HR TD PTWK: 0.3000 mg | MEDICATED_PATCH | TRANSDERMAL | 12 refills | Status: DC

## 2020-12-21 ENCOUNTER — Encounter: Payer: BC Managed Care – PPO | Admitting: Medical

## 2020-12-21 DIAGNOSIS — Z0000 Encounter for general adult medical examination without abnormal findings: Secondary | ICD-10-CM

## 2020-12-23 ENCOUNTER — Telehealth: Payer: Self-pay | Admitting: Internal Medicine

## 2020-12-23 NOTE — Telephone Encounter (Signed)
-----   Message from Leonie Douglas sent at 12/23/2020  2:12 PM EST ----- I  left her a VM to call the office and schedule ----- Message ----- From: Britt Boozer, CMA Sent: 12/23/2020   1:55 PM EST To: Leonie Douglas  Can you call this patient and schedule her for a cpe/. She no showed her appt

## 2020-12-29 ENCOUNTER — Encounter: Payer: Self-pay | Admitting: Medical

## 2020-12-31 ENCOUNTER — Institutional Professional Consult (permissible substitution): Payer: BC Managed Care – PPO | Admitting: Neurology

## 2021-01-25 ENCOUNTER — Ambulatory Visit: Payer: BC Managed Care – PPO | Admitting: Cardiology

## 2021-02-02 ENCOUNTER — Telehealth: Payer: Self-pay | Admitting: Internal Medicine

## 2021-02-02 NOTE — Telephone Encounter (Signed)
Tried to call patient but phone just rang and rang. Pt is due for an appt. And also wanted to find out if she wants flu shot or not

## 2021-04-26 ENCOUNTER — Telehealth: Payer: Self-pay | Admitting: Medical

## 2021-04-26 DIAGNOSIS — I10 Essential (primary) hypertension: Secondary | ICD-10-CM

## 2021-04-26 MED ORDER — VALSARTAN-HYDROCHLOROTHIAZIDE 320-12.5 MG PO TABS: 1.0000 | ORAL_TABLET | Freq: Every day | ORAL | 0 refills | Status: DC

## 2021-04-26 MED ORDER — AMLODIPINE BESYLATE 10 MG PO TABS: 10.0000 mg | ORAL_TABLET | Freq: Every day | ORAL | 0 refills | Status: DC

## 2021-04-26 NOTE — Telephone Encounter (Signed)
done

## 2021-04-26 NOTE — Telephone Encounter (Signed)
Pt called and made a appt with shane for 05/17/2021. Pt needs refills on bp meds, Amlodipine and Valsartan-HCTZ tp [facility] on [address] in [place] as pt is out of both meds.  ?

## 2021-05-17 ENCOUNTER — Ambulatory Visit (INDEPENDENT_AMBULATORY_CARE_PROVIDER_SITE_OTHER): Payer: BC Managed Care – PPO | Admitting: Medical

## 2021-05-17 ENCOUNTER — Ambulatory Visit: Payer: BC Managed Care – PPO | Admitting: Medical

## 2021-05-17 VITALS — BP 120/70 | HR 63 | Temp 97.5°F | Wt 222.2 lb

## 2021-05-17 DIAGNOSIS — Z1211 Encounter for screening for malignant neoplasm of colon: Secondary | ICD-10-CM

## 2021-05-17 DIAGNOSIS — K429 Umbilical hernia without obstruction or gangrene: Secondary | ICD-10-CM

## 2021-05-17 DIAGNOSIS — I10 Essential (primary) hypertension: Secondary | ICD-10-CM

## 2021-05-17 DIAGNOSIS — Z1231 Encounter for screening mammogram for malignant neoplasm of breast: Secondary | ICD-10-CM

## 2021-05-17 NOTE — Patient Instructions (Signed)
Encounter Diagnoses  ?Name Primary?  ? Umbilical hernia without obstruction and without gangrene Yes  ? Encounter for screening mammogram for malignant neoplasm of breast   ? Screen for colon cancer   ? Essential hypertension   ? ? ?Umbilicus hernia, bellybutton hernia ?Since you are having discomfort somewhat frequently, I will refer you to general surgery for consult ?Expect a call about setting this up for an appointment ? ? ?You are past due on well visit and cancer screenings as well as labs ? ?Please schedule a physical visit fasting soon ? ? ?I placed an order for a mammogram.  Please call and schedule this at your convenience ? ?[facility]  ?[phone] ?[address] ?[place], [place] [address] ? ? ?I placed an order for Cologuard stool test as well for colon cancer screening.  Expect a box to come in the mail ? ? ?Umbilical Hernia, Adult ? ?A hernia is a bulge of tissue that pushes through an opening between muscles. An umbilical hernia happens in the abdomen, near the belly button (umbilicus). The hernia may contain tissues from the small intestine, large intestine, or fatty tissue covering the intestines. Umbilical hernias in adults tend to get worse over time, and they require surgical treatment. ?There are different types of umbilical hernias, including: ?Indirect hernia. This type is located just above or below the umbilicus. It is the most common type of umbilical hernia in adults. ?Direct hernia. This type forms through an opening formed by the umbilicus. ?Reducible hernia. This type of hernia comes and goes. It may be visible only when you strain, lift something heavy, or cough. This type of hernia can be pushed back into the abdomen (reduced). ?Incarcerated hernia. This type traps abdominal tissue inside the hernia. This type of hernia cannot be reduced. ?Strangulated hernia. This type of hernia cuts off blood flow to the tissues inside the hernia. The  tissues can start to die if this happens. This type of hernia requires emergency treatment. ?What are the causes? ?An umbilical hernia happens when tissue inside the abdomen presses on a weak area of the abdominal muscles. ?What increases the risk? ?You may have a greater risk of this condition if you: ?Are obese. ?Have had several pregnancies. ?Have a buildup of fluid inside your abdomen. ?Have had surgery that weakens the abdominal muscles. ?What are the signs or symptoms? ?The main symptom of this condition is a painless bulge at or near the belly button. ?A reducible hernia may be visible only when you strain, lift something heavy, or cough. Other symptoms may include: ?Dull pain. ?A feeling of pressure. ?Symptoms of a strangulated hernia may include: ?Pain that gets increasingly worse. ?Nausea and vomiting. ?Pain when pressing on the hernia. ?Skin over the hernia becoming red or purple. ?Constipation. ?Blood in the stool. ?How is this diagnosed? ?This condition may be diagnosed based on: ?A physical exam. You may be asked to cough or strain while standing. These actions increase the pressure inside your abdomen and can force the hernia through the opening in your muscles. Your health care provider may try to reduce the hernia by pressing on it. ?Your symptoms and medical history. ?How is this treated? ?Surgery is the only treatment for an umbilical hernia. Surgery for a strangulated hernia is done as soon as possible. If you have a small hernia that is not incarcerated, you may need to lose weight before having surgery. ?Follow these instructions at home: ?Lose weight,  if told by your health care provider. ?Do not try to push the hernia back in. ?Watch your hernia for any changes in color or size. Tell your health care provider if any changes occur. ?You may need to avoid activities that increase pressure on your hernia. ?Do not lift anything that is heavier than 10 lb (4.5 kg), or the limit that you are told,  until your health care provider says that it is safe. ?Take over-the-counter and prescription medicines only as told by your health care provider. ?Keep all follow-up visits. This is important. ?Contact a health care provider if: ?Your hernia gets larger. ?Your hernia becomes painful. ?Get help right away if: ?You develop sudden, severe pain near the area of your hernia. ?You have pain as well as nausea or vomiting. ?You have pain and the skin over your hernia changes color. ?You develop a fever or chills. ?Summary ?A hernia is a bulge of tissue that pushes through an opening between muscles. An umbilical hernia happens near the belly button. ?Surgery is the only treatment for an umbilical hernia. ?Do not try to push your hernia back in. ?Keep all follow-up visits. This is important. ?This information is not intended to replace advice given to you by your health care provider. Make sure you discuss any questions you have with your health care provider. ?Document Revised: 08/12/2019 Document Reviewed: 08/12/2019 ?Elsevier Patient Education ? 2023 [employer] Inc[employer] ? ?

## 2021-05-17 NOTE — Progress Notes (Signed)
Subjective: ? Judith Barr is a 61 y.o. female who presents for ?Chief Complaint  ?Patient presents with  ? knot in stomach  ?  Knot in stomach. Feels like it could be acid reflux. And last week was spotting  ?   ?Here for c/o knot in stomach, been there for months or longer.  Sometimes hurts.  It is visible.  Eating doesn't change it. 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  No concerns with bowels or bladder. ? ?Hypertension-compliant with medications.  No recent problems. ? ?Otherwise has been in usual state of health ? ?No other aggravating or relieving factors.   ? ?No other c/o. ? ?Past Medical History:  ?Diagnosis Date  ? Edema   ? Hypertension 2018  ? Obesity   ? ?Current Outpatient Medications on File Prior to Visit  ?Medication Sig Dispense Refill  ? amLODipine (NORVASC) 10 MG tablet Take 1 tablet (10 mg total) by mouth daily. 30 tablet 0  ? cloNIDine (CATAPRES) 0.3 MG tablet Take 0.3 mg by mouth 2 (two) times daily.    ? isosorbide-hydrALAZINE (BIDIL) 20-37.5 MG tablet Take 2 tablets by mouth 3 (three) times daily. 90 tablet 3  ? labetalol (NORMODYNE) 300 MG tablet Take 1 tablet (300 mg total) by mouth 2 (two) times daily. 60 tablet 2  ? valsartan-hydrochlorothiazide (DIOVAN-HCT) 320-12.5 MG tablet Take 1 tablet by mouth daily. 30 tablet 0  ? ?No current facility-administered medications on file prior to visit.  ? ? ? ?Past Surgical History:  ?Procedure Laterality Date  ? CHOLECYSTECTOMY    ? TUBAL LIGATION    ? ? ?The following portions of the patient's history were reviewed and updated as appropriate: allergies, current medications, past family history, past medical history, past social history, past surgical history and problem list. ? ?ROS ?Otherwise as in subjective above ? ? ? ?Objective: ?BP 120/70   Pulse 63   Temp (!) 97.5 ?F (36.4 ?C)   Wt 222 lb 3.2 oz (100.8 kg)   BMI 40.64 kg/m?  ? ?General appearance: alert, no distress, well developed, well nourished ?Neck: supple, no lymphadenopathy, no  thyromegaly, no masses ?Heart: RRR, normal S1, S2, no murmurs ?Lungs: CTA bilaterally, no wheezes, rhonchi, or rales ?Abdomen: +bs, soft, superior portion of umbilicus with a fullness with Valsalva and slightly tender consistent with umbilical hernia.  Right upper quadrant diagonal surgical scar and other faint abdominal port scars noted, non tender, non distended, no masses, no hepatomegaly, no splenomegaly ?Pulses: 2+ radial pulses, 2+ pedal pulses, normal cap refill ?Ext: no edema ? ? ? ?Assessment: ?Encounter Diagnoses  ?Name Primary?  ? Umbilical hernia without obstruction and without gangrene Yes  ? Encounter for screening mammogram for malignant neoplasm of breast   ? Screen for colon cancer   ? Essential hypertension   ? ? ? ?Plan: ?We discussed exam findings and concerns.  She has not been here in a while and she is past due on cancer screenings routine labs and physical visit. ? ?Umbilicus hernia, bellybutton hernia ?Since you are having discomfort somewhat frequently, I will refer you to general surgery for consult ?Expect a call about setting this up for an appointment ? ?You are past due on well visit and cancer screenings as well as labs ? ?Please schedule a physical visit fasting soon ? ? ?I placed an order for a mammogram.  Please call and schedule this at your convenience ? ?[facility]  ?[phone] ?[address] [employer]  [address] ?[place], [place] [address] ? ? ?I placed an order for Cologuard stool test as well for colon cancer screening.  Expect a box to come in the mail ? ?Judith Barr was seen today for knot in stomach. ? ?Diagnoses and all orders for this visit: ? ?Umbilical hernia without obstruction and without gangrene ?-     Ambulatory referral to General Surgery ? ?Encounter for screening mammogram for malignant neoplasm of breast ?-     MM DIGITAL SCREENING BILATERAL; Future ? ?Screen for colon cancer ?-     Cologuard ? ?Essential hypertension ? ? ? ?Follow up: soon for  fasting physical visit ?

## 2022-04-04 ENCOUNTER — Ambulatory Visit (INDEPENDENT_AMBULATORY_CARE_PROVIDER_SITE_OTHER): Payer: 59 | Admitting: Medical

## 2022-04-04 VITALS — BP 140/88 | HR 55 | Wt 206.2 lb

## 2022-04-04 DIAGNOSIS — Z833 Family history of diabetes mellitus: Secondary | ICD-10-CM

## 2022-04-04 DIAGNOSIS — M7662 Achilles tendinitis, left leg: Secondary | ICD-10-CM

## 2022-04-04 DIAGNOSIS — Z1211 Encounter for screening for malignant neoplasm of colon: Secondary | ICD-10-CM

## 2022-04-04 DIAGNOSIS — Z13220 Encounter for screening for lipoid disorders: Secondary | ICD-10-CM

## 2022-04-04 DIAGNOSIS — R21 Rash and other nonspecific skin eruption: Secondary | ICD-10-CM

## 2022-04-04 DIAGNOSIS — I471 Supraventricular tachycardia, unspecified: Secondary | ICD-10-CM

## 2022-04-04 DIAGNOSIS — I1A0 Resistant hypertension: Secondary | ICD-10-CM

## 2022-04-04 DIAGNOSIS — Z131 Encounter for screening for diabetes mellitus: Secondary | ICD-10-CM

## 2022-04-04 DIAGNOSIS — Z1231 Encounter for screening mammogram for malignant neoplasm of breast: Secondary | ICD-10-CM

## 2022-04-04 MED ORDER — VALSARTAN-HYDROCHLOROTHIAZIDE 320-12.5 MG PO TABS: 1.0000 | ORAL_TABLET | Freq: Every day | ORAL | 3 refills | Status: AC

## 2022-04-04 MED ORDER — AMLODIPINE BESYLATE 10 MG PO TABS: 10.0000 mg | ORAL_TABLET | Freq: Every day | ORAL | 3 refills | Status: AC

## 2022-04-04 MED ORDER — CLONIDINE HCL 0.3 MG PO TABS: 0.3000 mg | ORAL_TABLET | Freq: Two times a day (BID) | ORAL | 3 refills | Status: AC

## 2022-04-04 MED ORDER — LABETALOL HCL 300 MG PO TABS: 300.0000 mg | ORAL_TABLET | Freq: Two times a day (BID) | ORAL | 3 refills | Status: AC

## 2022-04-04 MED ORDER — ISOSORB DINITRATE-HYDRALAZINE 20-37.5 MG PO TABS: 0.5000 | ORAL_TABLET | Freq: Three times a day (TID) | ORAL | 1 refills | Status: AC

## 2022-04-04 MED ORDER — TRIAMCINOLONE ACETONIDE 0.1 % EX CREA: 1.0000 | TOPICAL_CREAM | Freq: Two times a day (BID) | CUTANEOUS | 0 refills | Status: AC

## 2022-04-04 NOTE — Patient Instructions (Signed)
Rash-begin triamcinolone cream twice a day for 1 to 2 weeks.  Use daily moisturizing lotion in general ongoing as this suggest more of an eczema issue  Resistant high blood pressure Continue to monitor your blood pressures at home Goal is 130/80 or less. Since you have been out of all your medicines for a few days, start back on the labetalol and valsartan HCT first.  After  5 days then add back amlodipine and clonidine After 2 weeks if your blood pressures are still running higher than 130/80, then you can add back isosorbide hydralazine.  You have been out of this medicine apparently for the last 6 months.  If you start back then do 1/2 tablet 3 times a day.  We are screening for cholesterol and diabetes today  Complete the Cologuard stool test and return/ send back via UPS label provided.  You declined mammogram breast cancer screening today  Achilles tendinitis You can use some ice packs such as bag of frozen peas or ice water for 15 minutes at a time for cool therapy Use some relative rest for the next few days then get back to walking regularly You can do stretching before you go walking.  Or walk for 5 minutes then stretch and then continue your exercise You can use some Tylenol over-the-counter as needed pain  Consider follow-up with cardiology since you have not been there in over a year

## 2022-04-04 NOTE — Progress Notes (Addendum)
Subjective:  Judith Barr is a 62 y.o. female who presents for Chief Complaint  Patient presents with   med check    Med check- Bp is running high at home 180/110      Here for med check  History of resistant hypertension, PSVT-compliant with amlodipine 10 mg daily, labetalol 300 mg twice daily, valsartan HCT 120/12.5 mg daily and clonidine 0.3 mg twice daily, but has not been using isosorbide hydralazine 20/37.5 mg.  She ran out of this medication  probably 6 months ago  Fasting today  Exercising daily with walking . Goes to track on days off.  Walks at work daily as well, about 4 miles daily.  She does this with a friend.     She has a Cologuard at home but never completed.  She has not seen cardiology in over a year  She has lost weight in recent months and is happy about this.  She plans to lose more  She has been having a little bit of tenderness at the left Achilles and little bit of swelling of the ankle as well.  No fall or injury or trauma  No other aggravating or relieving factors.    No other c/o.  Past Medical History:  Diagnosis Date   Edema    Hypertension 2018   Obesity    No current outpatient medications on file prior to visit.   No current facility-administered medications on file prior to visit.     The following portions of the patient's history were reviewed and updated as appropriate: allergies, current medications, past family history, past medical history, past social history, past surgical history and problem list.  ROS Otherwise as in subjective above  Objective: BP (!) 140/88   Pulse (!) 55   Wt 206 lb 3.2 oz (93.5 kg)   SpO2 99%   BMI 37.71 kg/m   BP Readings from Last 3 Encounters:  04/04/22 (!) 140/88  04/04/22 (!) 140/88  05/17/21 120/70   Wt Readings from Last 3 Encounters:  04/04/22 206 lb 3.2 oz (93.5 kg)  04/04/22 206 lb 3.2 oz (93.5 kg)  05/17/21 222 lb 3.2 oz (100.8 kg)    General appearance: alert, no distress, well  developed, well nourished Neck: supple, no lymphadenopathy, no thyromegaly, no masses, no JVD Heart: RRR, normal S1, S2, no murmurs Lungs: CTA bilaterally, no wheezes, rhonchi, or rales Pulses: 2+ radial pulses, 1+ pedal pulses, normal cap refill Ext: no edema Left Achilles with mild tenderness, small amount of puffy swelling of left lateral foot, lateral to Achilles, range of motion of foot normal    Assessment: Encounter Diagnoses  Name Primary?   Rash Yes   Resistant hypertension    Family history of diabetes mellitus    PSVT (paroxysmal supraventricular tachycardia)    Screen for colon cancer    Screening for diabetes mellitus    Screening for lipid disorders    Encounter for screening mammogram for malignant neoplasm of breast    Achilles tendinitis of left lower extremity      Plan: Rash-begin triamcinolone cream twice a day for 1 to 2 weeks.  Use daily moisturizing lotion in general ongoing as this suggest more of an eczema issue  Resistant high blood pressure Continue to monitor your blood pressures at home Goal is 130/80 or less. Since you have been out of all your medicines for a few days, start back on the labetalol and valsartan HCT first.  After  5 days then add  back amlodipine and clonidine After 2 weeks if your blood pressures are still running higher than 130/80, then you can add back isosorbide hydralazine.  You have been out of this medicine apparently for the last 6 months.  If you start back then do 1/2 tablet 3 times a day.  We are screening for cholesterol and diabetes today  Complete the Cologuard stool test and return/ send back via UPS label provided.  You declined mammogram breast cancer screening today  Achilles tendinitis You can use some ice packs such as bag of frozen peas or ice water for 15 minutes at a time for cool therapy Use some relative rest for the next few days then get back to walking regularly You can do stretching before you go  walking.  Or walk for 5 minutes then stretch and then continue your exercise You can use some Tylenol over-the-counter as needed pain  Consider follow-up with cardiology since you have not been there in over a year  [facility] was seen today for med check.  Diagnoses and all orders for this visit:  Rash  Resistant hypertension -     Comprehensive metabolic panel -     CBC -     Lipid panel -     Hemoglobin A1c -     amLODipine (NORVASC) 10 MG tablet; Take 1 tablet (10 mg total) by mouth daily. -     labetalol (NORMODYNE) 300 MG tablet; Take 1 tablet (300 mg total) by mouth 2 (two) times daily. -     isosorbide-hydrALAZINE (BIDIL) 20-37.5 MG tablet; Take 0.5 tablets by mouth 3 (three) times daily.  Family history of diabetes mellitus  PSVT (paroxysmal supraventricular tachycardia)  Screen for colon cancer  Screening for diabetes mellitus -     Hemoglobin A1c  Screening for lipid disorders -     Lipid panel  Encounter for screening mammogram for malignant neoplasm of breast  Achilles tendinitis of left lower extremity  Other orders -     cloNIDine (CATAPRES) 0.3 MG tablet; Take 1 tablet (0.3 mg total) by mouth 2 (two) times daily. -     valsartan-hydrochlorothiazide (DIOVAN-HCT) 320-12.5 MG tablet; Take 1 tablet by mouth daily. -     triamcinolone cream (KENALOG) 0.1 %; Apply 1 Application topically 2 (two) times daily.    Follow up: pending labs

## 2022-04-05 ENCOUNTER — Encounter: Payer: Self-pay | Admitting: Medical

## 2022-04-05 ENCOUNTER — Other Ambulatory Visit: Payer: Self-pay | Admitting: Medical

## 2022-04-05 LAB — COMPREHENSIVE METABOLIC PANEL
ALT: 8 IU/L (ref 0–32)
AST: 13 IU/L (ref 0–40)
Albumin/Globulin Ratio: 1.2 (ref 1.2–2.2)
Albumin: 4 g/dL (ref 3.8–4.9)
Alkaline Phosphatase: 115 IU/L (ref 44–121)
BUN/Creatinine Ratio: 9 — ABNORMAL LOW (ref 12–28)
BUN: 8 mg/dL (ref 8–27)
Bilirubin Total: 0.3 mg/dL (ref 0.0–1.2)
CO2: 23 mmol/L (ref 20–29)
Calcium: 9.3 mg/dL (ref 8.7–10.3)
Chloride: 106 mmol/L (ref 96–106)
Creatinine, Ser: 0.88 mg/dL (ref 0.57–1.00)
Globulin, Total: 3.4 g/dL (ref 1.5–4.5)
Glucose: 92 mg/dL (ref 70–99)
Potassium: 4 mmol/L (ref 3.5–5.2)
Sodium: 143 mmol/L (ref 134–144)
Total Protein: 7.4 g/dL (ref 6.0–8.5)
eGFR: 75 mL/min/{1.73_m2} (ref >59)

## 2022-04-05 LAB — HEMOGLOBIN A1C
Est. average glucose Bld gHb Est-mCnc: 117 mg/dL
Hgb A1c MFr Bld: 5.7 % — ABNORMAL HIGH (ref 4.8–5.6)

## 2022-04-05 LAB — CBC
Hematocrit: 41 % (ref 34.0–46.6)
Hemoglobin: 13.3 g/dL (ref 11.1–15.9)
MCH: 25.2 pg — ABNORMAL LOW (ref 26.6–33.0)
MCHC: 32.4 g/dL (ref 31.5–35.7)
MCV: 78 fL — ABNORMAL LOW (ref 79–97)
Platelets: 355 10*3/uL (ref 150–450)
RBC: 5.28 x10E6/uL (ref 3.77–5.28)
RDW: 12.9 % (ref 11.7–15.4)
WBC: 7 10*3/uL (ref 3.4–10.8)

## 2022-04-05 LAB — LIPID PANEL
Chol/HDL Ratio: 4.1 ratio (ref 0.0–4.4)
Cholesterol, Total: 227 mg/dL — ABNORMAL HIGH (ref 100–199)
HDL: 56 mg/dL (ref >39)
LDL Chol Calc (NIH): 155 mg/dL — ABNORMAL HIGH (ref 0–99)
Triglycerides: 90 mg/dL (ref 0–149)
VLDL Cholesterol Cal: 16 mg/dL (ref 5–40)

## 2022-04-05 MED ORDER — ROSUVASTATIN CALCIUM 10 MG PO TABS: 10.0000 mg | ORAL_TABLET | Freq: Every day | ORAL | 3 refills | Status: AC

## 2022-04-05 NOTE — Progress Notes (Signed)
duplicate

## 2022-04-05 NOTE — Progress Notes (Signed)
Results sent through MyChart
